# Patient Record
Sex: Female | Born: 1975
Health system: Southern US, Community
[De-identification: ages and names within clinical notes are randomized; demographics above are authoritative.]

## PROBLEM LIST (undated history)

## (undated) DIAGNOSIS — J45909 Unspecified asthma, uncomplicated: Secondary | ICD-10-CM

## (undated) DIAGNOSIS — F419 Anxiety disorder, unspecified: Secondary | ICD-10-CM

## (undated) DIAGNOSIS — K219 Gastro-esophageal reflux disease without esophagitis: Secondary | ICD-10-CM

## (undated) DIAGNOSIS — G4733 Obstructive sleep apnea (adult) (pediatric): Principal | ICD-10-CM

## (undated) DIAGNOSIS — D649 Anemia, unspecified: Secondary | ICD-10-CM

## (undated) DIAGNOSIS — T7840XA Allergy, unspecified, initial encounter: Secondary | ICD-10-CM

## (undated) HISTORY — DX: Obstructive sleep apnea (adult) (pediatric): G47.33

## (undated) HISTORY — DX: Anemia, unspecified: D64.9

## (undated) HISTORY — DX: Unspecified asthma, uncomplicated: J45.909

## (undated) HISTORY — DX: Allergy, unspecified, initial encounter: T78.40XA

## (undated) HISTORY — DX: Anxiety disorder, unspecified: F41.9

## (undated) HISTORY — DX: Gastro-esophageal reflux disease without esophagitis: K21.9

## (undated) HISTORY — PX: WISDOM TOOTH EXTRACTION: SHX21

---

## 1999-09-22 ENCOUNTER — Other Ambulatory Visit: Admission: RE | Admit: 1999-09-22 | Discharge: 1999-09-22 | Payer: Self-pay | Admitting: Obstetrics and Gynecology

## 2000-12-04 ENCOUNTER — Other Ambulatory Visit: Admission: RE | Admit: 2000-12-04 | Discharge: 2000-12-04 | Payer: Self-pay | Admitting: Obstetrics and Gynecology

## 2002-11-18 ENCOUNTER — Other Ambulatory Visit: Admission: RE | Admit: 2002-11-18 | Discharge: 2002-11-18 | Payer: Self-pay | Admitting: Obstetrics and Gynecology

## 2003-06-16 ENCOUNTER — Inpatient Hospital Stay (HOSPITAL_COMMUNITY): Admission: AD | Admit: 2003-06-16 | Discharge: 2003-06-19 | Payer: Self-pay | Admitting: Obstetrics and Gynecology

## 2003-12-08 ENCOUNTER — Other Ambulatory Visit: Admission: RE | Admit: 2003-12-08 | Discharge: 2003-12-08 | Payer: Self-pay | Admitting: Obstetrics and Gynecology

## 2004-12-27 ENCOUNTER — Other Ambulatory Visit: Admission: RE | Admit: 2004-12-27 | Discharge: 2004-12-27 | Payer: Self-pay | Admitting: Obstetrics and Gynecology

## 2005-09-27 ENCOUNTER — Inpatient Hospital Stay (HOSPITAL_COMMUNITY): Admission: AD | Admit: 2005-09-27 | Discharge: 2005-09-27 | Payer: Self-pay | Admitting: Obstetrics & Gynecology

## 2005-11-13 ENCOUNTER — Inpatient Hospital Stay (HOSPITAL_COMMUNITY): Admission: AD | Admit: 2005-11-13 | Discharge: 2005-11-15 | Payer: Self-pay | Admitting: Obstetrics & Gynecology

## 2006-02-15 ENCOUNTER — Other Ambulatory Visit: Admission: RE | Admit: 2006-02-15 | Discharge: 2006-02-15 | Payer: Self-pay | Admitting: Obstetrics and Gynecology

## 2006-03-15 ENCOUNTER — Encounter: Admission: RE | Admit: 2006-03-15 | Discharge: 2006-05-11 | Payer: Self-pay | Admitting: Sports Medicine

## 2008-10-02 ENCOUNTER — Ambulatory Visit (HOSPITAL_COMMUNITY): Admission: RE | Admit: 2008-10-02 | Discharge: 2008-10-02 | Payer: Self-pay | Admitting: Obstetrics and Gynecology

## 2008-10-02 ENCOUNTER — Encounter (INDEPENDENT_AMBULATORY_CARE_PROVIDER_SITE_OTHER): Payer: Self-pay | Admitting: Obstetrics and Gynecology

## 2009-05-30 ENCOUNTER — Observation Stay (HOSPITAL_COMMUNITY): Admission: AD | Admit: 2009-05-30 | Discharge: 2009-05-31 | Payer: Self-pay | Admitting: Obstetrics and Gynecology

## 2009-06-01 ENCOUNTER — Ambulatory Visit (HOSPITAL_COMMUNITY): Admission: RE | Admit: 2009-06-01 | Discharge: 2009-06-01 | Payer: Self-pay | Admitting: Obstetrics and Gynecology

## 2009-06-19 ENCOUNTER — Inpatient Hospital Stay (HOSPITAL_COMMUNITY): Admission: AD | Admit: 2009-06-19 | Discharge: 2009-06-19 | Payer: Self-pay | Admitting: Obstetrics and Gynecology

## 2009-06-20 ENCOUNTER — Inpatient Hospital Stay (HOSPITAL_COMMUNITY): Admission: AD | Admit: 2009-06-20 | Discharge: 2009-07-06 | Payer: Self-pay | Admitting: Obstetrics and Gynecology

## 2009-07-06 ENCOUNTER — Encounter: Payer: Self-pay | Admitting: Obstetrics and Gynecology

## 2009-10-07 ENCOUNTER — Encounter (INDEPENDENT_AMBULATORY_CARE_PROVIDER_SITE_OTHER): Payer: Self-pay | Admitting: Obstetrics and Gynecology

## 2009-10-07 ENCOUNTER — Inpatient Hospital Stay (HOSPITAL_COMMUNITY): Admission: RE | Admit: 2009-10-07 | Discharge: 2009-10-08 | Payer: Self-pay | Admitting: Obstetrics and Gynecology

## 2009-11-04 ENCOUNTER — Ambulatory Visit: Admission: RE | Admit: 2009-11-04 | Discharge: 2009-11-04 | Payer: Self-pay | Admitting: Obstetrics and Gynecology

## 2009-11-16 LAB — CONVERTED CEMR LAB: Pap Smear: NORMAL

## 2010-03-10 ENCOUNTER — Ambulatory Visit: Payer: Self-pay | Admitting: Family

## 2010-03-11 ENCOUNTER — Encounter: Payer: Self-pay | Admitting: Family

## 2010-03-15 ENCOUNTER — Encounter: Payer: Self-pay | Admitting: Family

## 2010-04-20 ENCOUNTER — Ambulatory Visit: Payer: Self-pay | Admitting: Family

## 2010-04-20 LAB — CONVERTED CEMR LAB
ALT: 24 units/L (ref 0–35)
AST: 20 units/L (ref 0–37)
Albumin: 4.1 g/dL (ref 3.5–5.2)
Alkaline Phosphatase: 81 units/L (ref 39–117)
Basophils Relative: 0 % (ref 0–1)
CO2: 22 meq/L (ref 19–32)
Calcium: 8.6 mg/dL (ref 8.4–10.5)
Chloride: 105 meq/L (ref 96–112)
Cholesterol: 143 mg/dL (ref 0–200)
Creatinine, Ser: 0.64 mg/dL (ref 0.40–1.20)
Eosinophils Absolute: 0.4 10*3/uL (ref 0.0–0.7)
Eosinophils Relative: 4 % (ref 0–5)
Glucose, Bld: 94 mg/dL (ref 70–99)
HCT: 36.2 % (ref 36.0–46.0)
Hemoglobin: 11.8 g/dL — ABNORMAL LOW (ref 12.0–15.0)
LDL Cholesterol: 79 mg/dL (ref 0–99)
Lymphocytes Relative: 33 % (ref 12–46)
Lymphs Abs: 4.1 10*3/uL — ABNORMAL HIGH (ref 0.7–4.0)
MCHC: 32.6 g/dL (ref 30.0–36.0)
MCV: 86.4 fL (ref 78.0–100.0)
Monocytes Relative: 7 % (ref 3–12)
Neutro Abs: 7.1 10*3/uL (ref 1.7–7.7)
Neutrophils Relative %: 57 % (ref 43–77)
Platelets: 319 10*3/uL (ref 150–400)
Potassium: 4.2 meq/L (ref 3.5–5.3)
RBC: 4.19 M/uL (ref 3.87–5.11)
Sodium: 139 meq/L (ref 135–145)
TSH: 1.469 microintl units/mL (ref 0.350–4.500)
Total Bilirubin: 0.4 mg/dL (ref 0.3–1.2)
Total CHOL/HDL Ratio: 3.4
Total Protein: 7.2 g/dL (ref 6.0–8.3)
VLDL: 22 mg/dL (ref 0–40)
WBC: 12.5 10*3/uL — ABNORMAL HIGH (ref 4.0–10.5)

## 2010-04-21 ENCOUNTER — Telehealth: Payer: Self-pay | Admitting: Family

## 2010-04-21 ENCOUNTER — Encounter: Payer: Self-pay | Admitting: Family

## 2010-04-21 DIAGNOSIS — D509 Iron deficiency anemia, unspecified: Secondary | ICD-10-CM

## 2010-04-21 LAB — CONVERTED CEMR LAB
Ferritin: 22 ng/mL (ref 10–291)
Folate: >20 ng/mL
Iron: 41 ug/dL — ABNORMAL LOW (ref 42–145)
Saturation Ratios: 13 % — ABNORMAL LOW (ref 20–55)
TIBC: 312 ug/dL (ref 250–470)
UIBC: 271 ug/dL
Vitamin B-12: 671 pg/mL (ref 211–911)

## 2010-10-15 ENCOUNTER — Ambulatory Visit: Payer: Self-pay | Admitting: Family

## 2010-10-15 LAB — CONVERTED CEMR LAB
Inflenza A Ag: NEGATIVE
Influenza B Ag: NEGATIVE
Rapid Strep: NEGATIVE

## 2010-11-21 IMAGING — US US OB FOLLOW-UP
1 series · 14 of 28 positions shown · non-contrast
Comparison: none

OBSTETRICAL ULTRASOUND:
 This ultrasound exam was performed in the [HOSPITAL] Ultrasound Department.  The OB US report was generated in the AS system, and faxed to the ordering physician.  This report is also available in [REDACTED] PACS.

[Series 1: us ob follow up · 0.25mm/px · 14 of 58 slices shown]
[im 3/58]
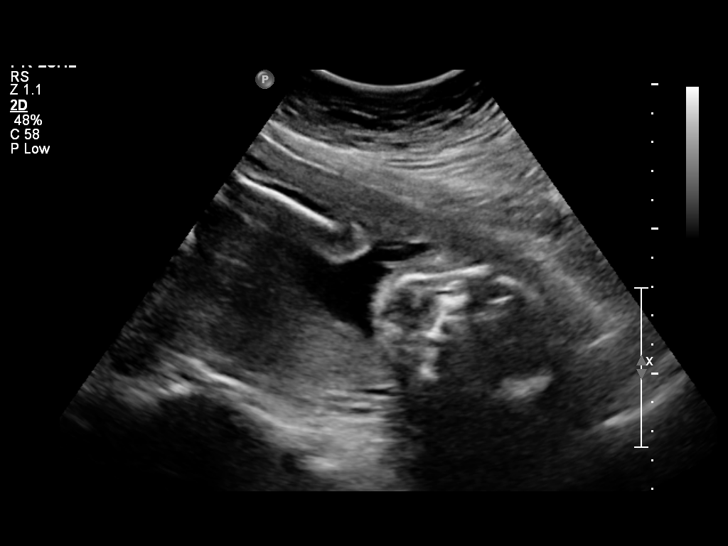
[im 7/58]
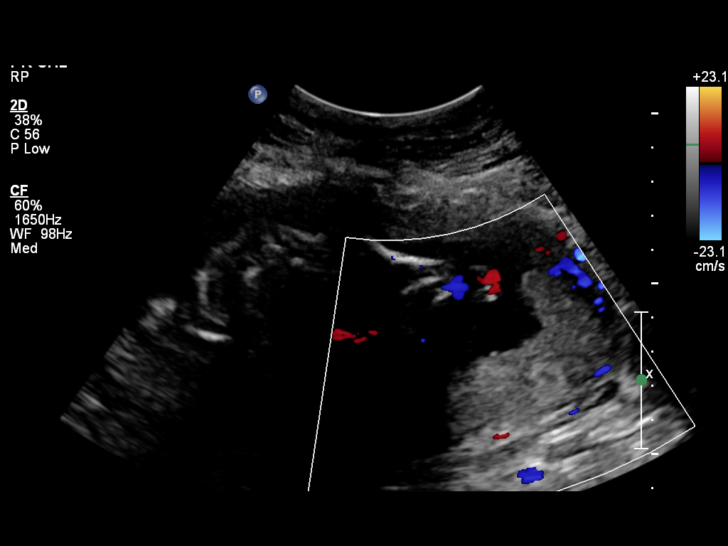
[im 11/58]
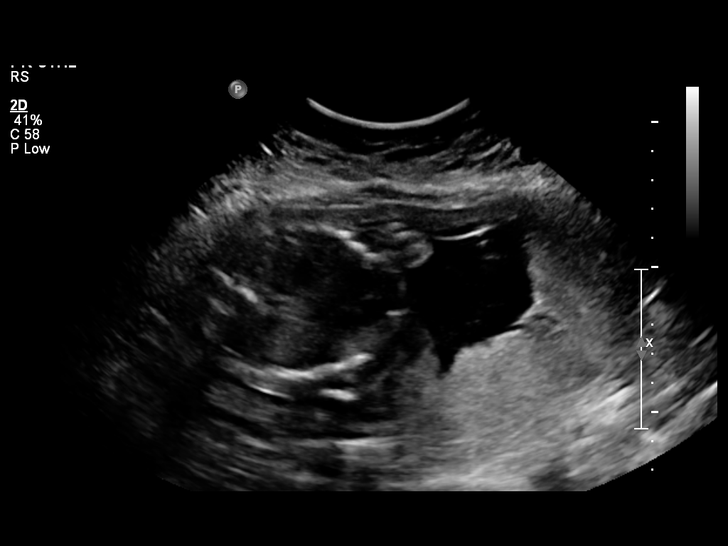
[im 15/58]
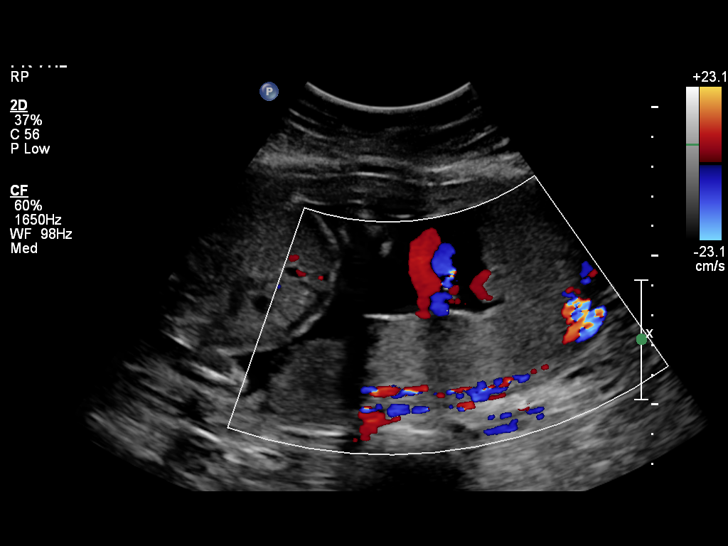
[im 20/58]
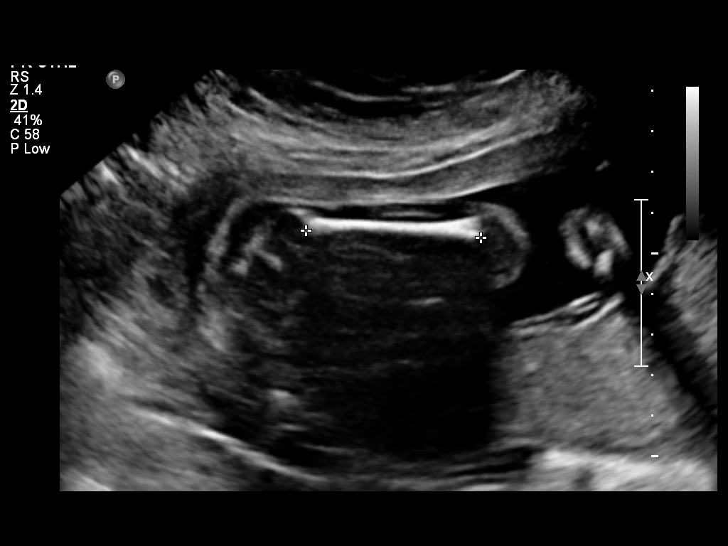
[im 24/58]
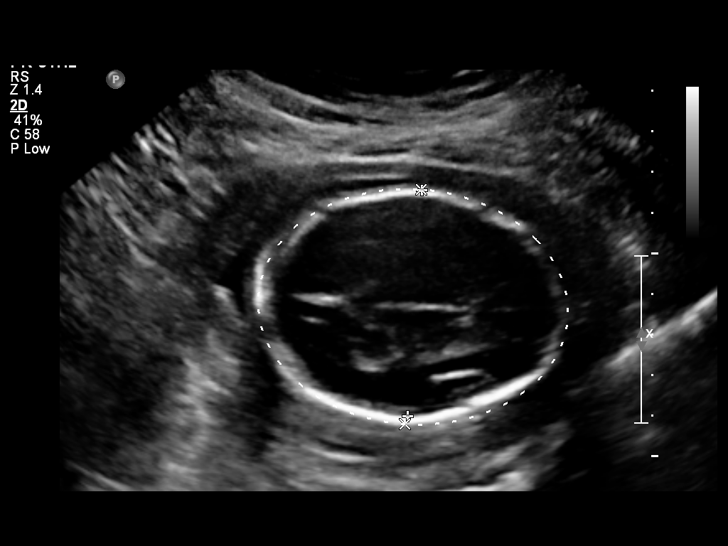
[im 28/58]
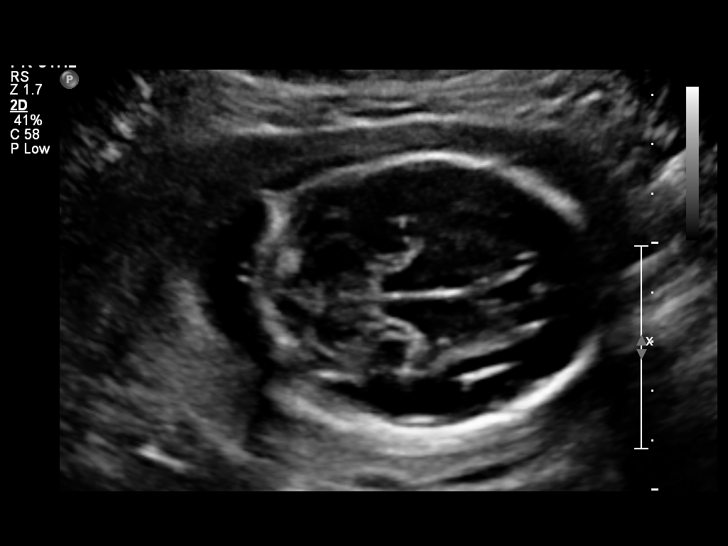
[im 32/58]
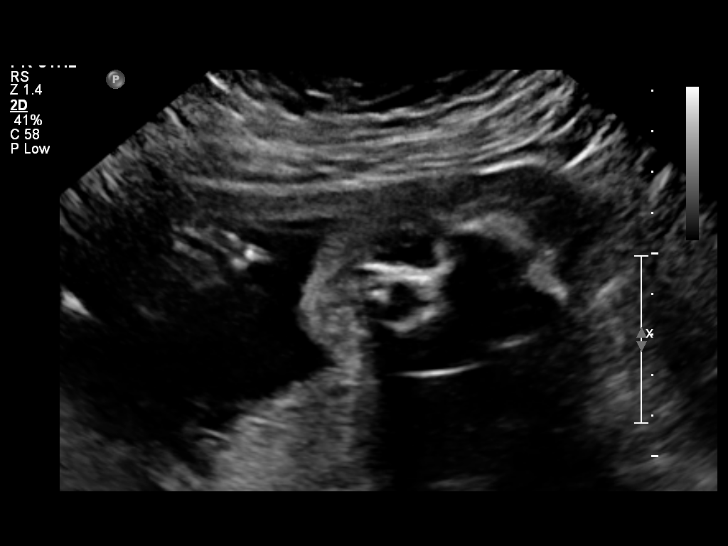
[im 36/58]
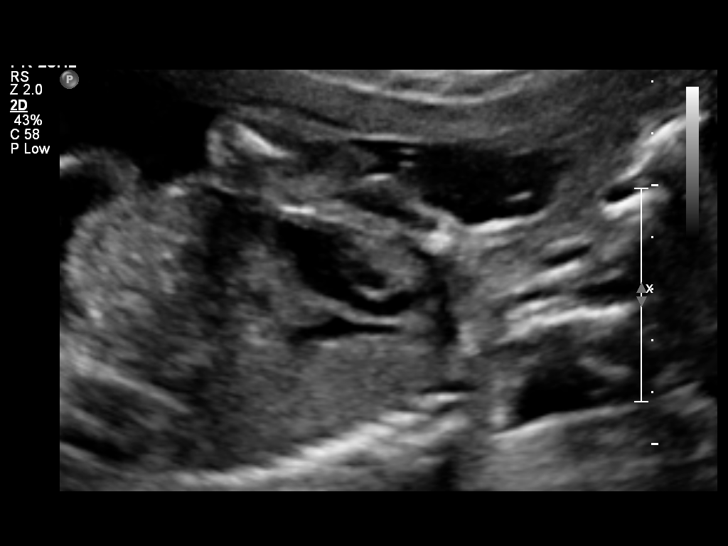
[im 41/58]
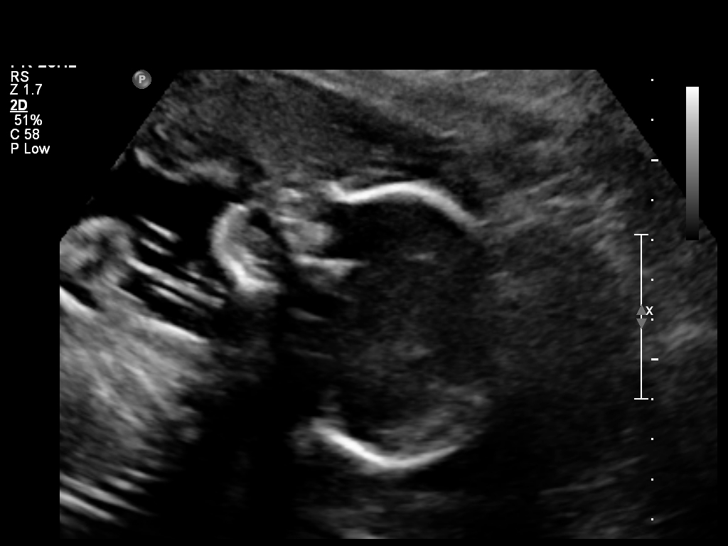
[im 45/58]
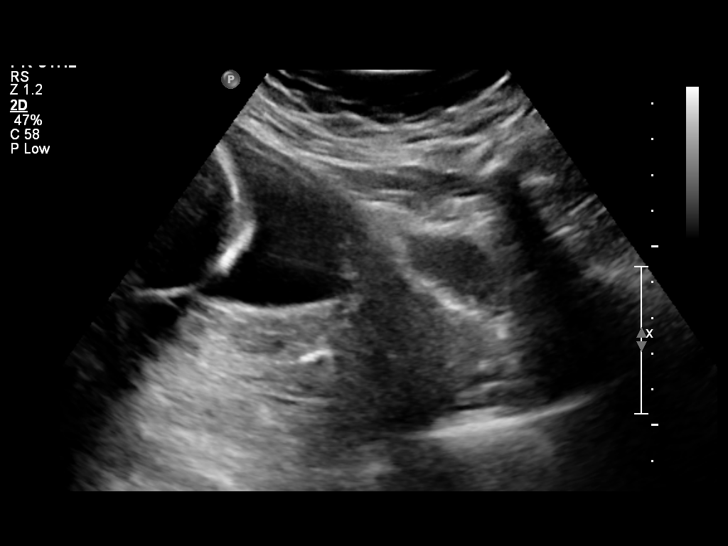
[im 49/58]
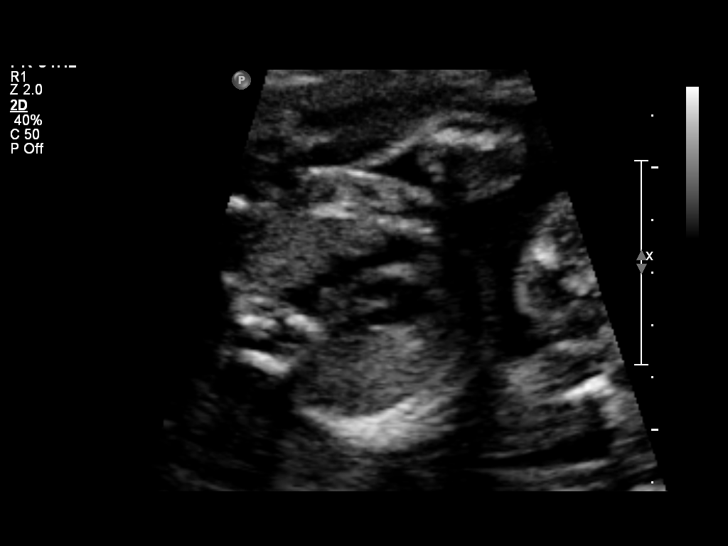
[im 53/58]
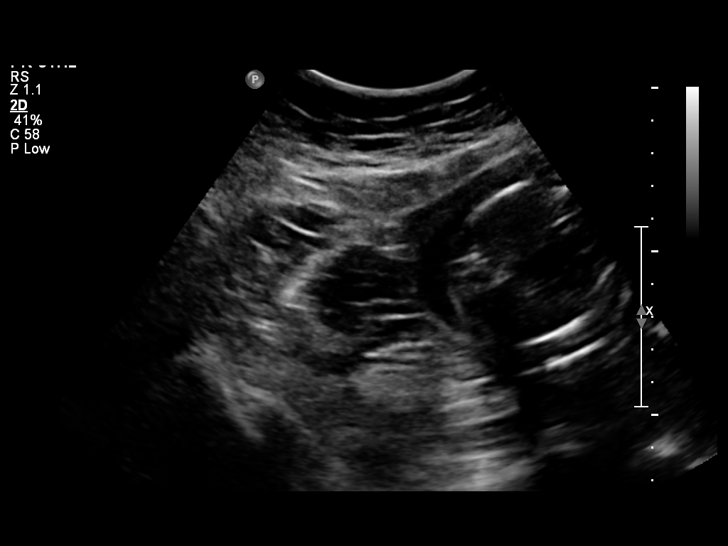
[im 58/58]
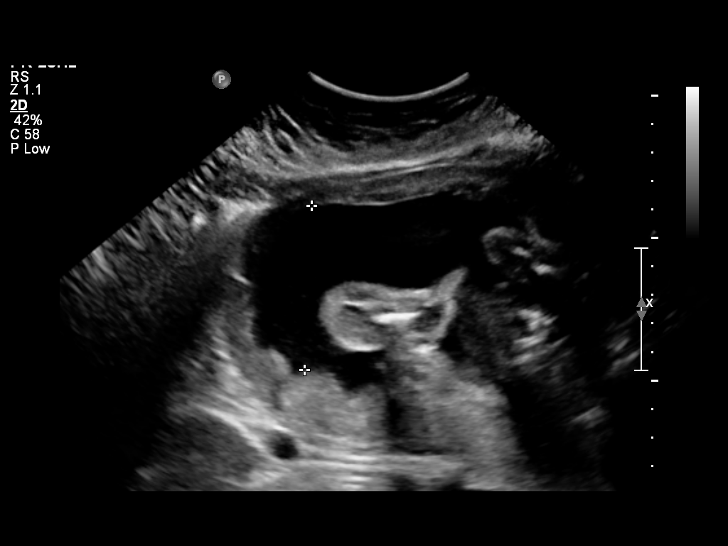

[14 of 28 positions shown; findings below may reference images not displayed]

IMPRESSION: See AS Obstetric US report.

## 2010-12-06 ENCOUNTER — Encounter: Payer: Self-pay | Admitting: Obstetrics and Gynecology

## 2010-12-14 NOTE — Assessment & Plan Note (Signed)
Summary: NEW PT FEELS LIKE SHE HAS FLU/DT   Vital Signs:  Patient profile:   35 year old female Height:      63.5 inches Weight:      215.50 pounds BMI:     37.71 Temp:     98.0 degrees F oral Pulse rate:   84 / minute Pulse rhythm:   regular Resp:     16 per minute BP sitting:   130 / 86  (right arm) Cuff size:   k  Vitals Entered By: Mervin Kung CMA (March 10, 2010 9:28 AM) CC: room 5   Head congestion x 1 week, productive cough and fatigue. Is Patient Diabetic? No   CC:  room 5   Head congestion x 1 week and productive cough and fatigue.Marland Kitchen  History of Present Illness: Valerie Burke is a 35 year old female who presents today to establish care.  She notes + sore throat, nasal congestion, malaise x 1 week.   Denies fever.  Notes intermittent sinus pressure- nasal discharge is yellow and thick, sputum also yellow.   + cough, breathing feels tight.  Has tried nothing OTC.  Notes that her daughter has RSV.  Preventive Screening-Counseling & Management  Alcohol-Tobacco     Alcohol drinks/day: <1     Alcohol type: wine     Smoking Status: never  Caffeine-Diet-Exercise     Caffeine use/day: 2-3 cans daily     Does Patient Exercise: no  Allergies (verified): No Known Drug Allergies  Past History:  Past Medical History: Anemia in pregnancy Asthma Chicken Pox Frequent Headaches UTIs  Family History: Heart Disease--maternal grandfather, deceased Stroke--maternal grandfather Alcoholism, Cirrhosis--paternal grandfather  Social History: Married Never Smoked Alcohol use-yes Regular exercise-no Smoking Status:  never Caffeine use/day:  2-3 cans daily Does Patient Exercise:  no  Physical Exam  General:  Well-developed,well-nourished,in no acute distress; alert,appropriate and cooperative throughout examination Head:  Normocephalic and atraumatic without obvious abnormalities. No apparent alopecia or balding. Ears:  External ear exam shows no significant lesions or  deformities.  Otoscopic examination reveals clear canals, tympanic membranes are intact bilaterally without bulging, retraction, inflammation or discharge. Hearing is grossly normal bilaterally. Mouth:  + pharyngeal erythema without exudate Lungs:  Normal respiratory effort, chest expands symmetrically. Lungs are clear to auscultation, no crackles or wheezes. Heart:  Normal rate and regular rhythm. S1 and S2 normal without gallop, murmur, click, rub or other extra sounds. Cervical Nodes:  mildly enlarged cervical lymph nodes bilaterally.   Psych:  Cognition and judgment appear intact. Alert and cooperative with normal attention span and concentration. No apparent delusions, illusions, hallucinations   Impression & Recommendations:  Problem # 1:  SINUSITIS (ICD-473.9) Assessment New Will treat with Augmentin.  Plan to send throat culture to rule out strep.  Suspect mild associated bronchitis as well.  Pt to call if symptoms worsen or do not improve.  Her updated medication list for this problem includes:    Augmentin 500-125 Mg Tabs (Amoxicillin-pot clavulanate) ..... One tablet by mouth two times a day x 10 days  Complete Medication List: 1)  Xyzal 5 Mg Tabs (Levocetirizine dihydrochloride) .... Take 1 tablet by mouth once a day 2)  Augmentin 500-125 Mg Tabs (Amoxicillin-pot clavulanate) .... One tablet by mouth two times a day x 10 days  Other Orders: T-Culture, Throat ([id number])  Patient Instructions: 1)  Call if symptoms worsen or do not improve. 2)  Please return fasting for a complete physical in 1 month. Prescriptions: XYZAL 5 MG  TABS (LEVOCETIRIZINE DIHYDROCHLORIDE) Take 1 tablet by mouth once a day  #30 x 2   Entered and Authorized by:   Valerie Burke   Signed by:   Valerie Burke on 03/10/2010   Method used:   Electronically to        [facility] [phone]* (retail)       [address]       [place], [place]  [address]       Ph: [phone]       Fax: [phone]   RxID:   [id number] AUGMENTIN 500-125 MG TABS (AMOXICILLIN-POT CLAVULANATE) one tablet by mouth two times a day x 10 days  #20 x 0   Entered and Authorized by:   Valerie Burke   Signed by:   Valerie Burke on 03/10/2010   Method used:   Electronically to        [facility] #[id number]* (retail)       [address]       [place], [place]  [address]       Ph: [phone]       Fax: [phone]   RxID:   [id number]    Preventive Care Screening  Pap Smear:    Date:  11/16/2009    Results:  normal   PPD:    Date:  11/16/1998    Results:  negative     Immunization History:  Influenza Immunization History:    Influenza:  historical (09/14/2009)   Current Allergies (reviewed today): No known allergies

## 2010-12-14 NOTE — Miscellaneous (Signed)
  Clinical Lists Changes  Problems: Added new problem of ANEMIA (ICD-285.9) 

## 2010-12-14 NOTE — Letter (Signed)
   [facility] [address] [place], [place]  [address]  [place] Phone: [phone]      Mar 15, 2010   GWENDLOYN FORSEE [address] [place], [place] [address]  RE:  LAB RESULTS  Dear  Ms. Leblond,  The following is an interpretation of your most recent lab tests.  Please take note of any instructions provided or changes to medications that have resulted from your lab work. Throat culture is negative   Sincerely Yours,    Lemont Fillers FNP

## 2010-12-14 NOTE — Assessment & Plan Note (Signed)
Summary: cpx/mhf   Vital Signs:  Patient profile:   35 year old female Height:      63.5 inches Weight:      218.50 pounds BMI:     38.24 Temp:     97.6 degrees F oral Pulse rate:   84 / minute Pulse rhythm:   regular Resp:     16 per minute BP sitting:   124 / 80  (right arm) Cuff size:   large  Vitals Entered By: Mervin Kung CMA (April 20, 2010 9:16 AM) CC: room 4  Physical. Pt states that she still has a lingering cough since the end of April. Is Patient Diabetic? No   CC:  room 4  Physical. Pt states that she still has a lingering cough since the end of April.Marland Kitchen  History of Present Illness: Valerie Burke is a 35 year old female who presents today for a complete physical exam.    Preventative-  Does not exercise regulary.   Non-smoker.  She notes that she is up about 20 pounds since she became pregnant with her 41 month old daughter.  Diet is poor- does not eat regularly. Pap smear up to date (Dr. Waynard Reeds at [facility])  Preventive Screening-Counseling & Management  Alcohol-Tobacco     Alcohol drinks/day: <1     Alcohol type: mixed drink     Smoking Status: never  Caffeine-Diet-Exercise     Does Patient Exercise: no  Allergies (verified): No Known Drug Allergies  Family History: Heart Disease--maternal grandfather, deceased Stroke--maternal grandfather Alcoholism, Cirrhosis--paternal grandfather  Mom- living, allergies, asthma, osteoporosis Dad- living Gout Brother- healthy 2004 Girls- "sensory integration disorder" undergoing PT/OT 2006 Girl- health 2010 Girl- eczema  Social History: Married Never Smoked Alcohol use-yes rare (every few months) Regular exercise-no  Review of Systems       Constitutional: Denies Fever ENT:  Denies nasal congestion or sore throat. Resp: mild cough has been told that she has history of "allergy induced asthma." CV:  Denies Chest Pain GI:  Denies nausea or vomitting GU: Denies dysuria Lymphatic: Denies  lymphadenopathy Musculoskeletal:  Denies muscle/joint pain Skin:  Denies Rashes or lesions Psychiatric: Denies depression or anxiety Neuro: Denies numbness or weakness     Physical Exam  General:  Well-developed, overweight white female, in no acute distress; alert,appropriate and cooperative throughout examination Head:  Normocephalic and atraumatic without obvious abnormalities. No apparent alopecia or balding. Eyes:  PERRLA Ears:  External ear exam shows no significant lesions or deformities.  Otoscopic examination reveals clear canals, tympanic membranes are intact bilaterally without bulging, retraction, inflammation or discharge. Hearing is grossly normal bilaterally. Mouth:  Oral mucosa and oropharynx without lesions or exudates.  Teeth in good repair. Neck:  No deformities, masses, or tenderness noted. Breasts:  deferred (completed by GYN) Lungs:  Normal respiratory effort, chest expands symmetrically. Lungs are clear to auscultation, no crackles or wheezes. Heart:  Normal rate and regular rhythm. S1 and S2 normal without gallop, murmur, click, rub or other extra sounds. Abdomen:  Bowel sounds positive,abdomen soft and non-tender without masses, organomegaly or hernias noted. Genitalia:  deferred to GYN Msk:  normal ROM, no joint swelling, and no joint warmth.   Extremities:  No clubbing, cyanosis, edema, or deformity noted with normal full range of motion of all joints.   Neurologic:  No cranial nerve deficits noted. Station and gait are normal. Plantar reflexes are down-going bilaterally. DTRs are symmetrical throughout. Sensory, motor and coordinative functions appear intact. Skin:  nodular scar on right lateral shin and left shin in area of previous mosquito bite. Cervical Nodes:  No lymphadenopathy noted Psych:  Cognition and judgment appear intact. Alert and cooperative with normal attention span and concentration. No apparent delusions, illusions,  hallucinations   Impression & Recommendations:  Problem # 1:  Preventive Health Care (ICD-V70.0) Assessment Comment Only  Patient counseled on weight loss and exercise.  She plans to join [employer]. Immunizations reviewed and up to date.  Will check fasting lab work today.  Orders: T-Comprehensive Metabolic Panel [id number]) T-Lipid Profile [id number]) T-CBC w/Diff [id number]) T-TSH [id number])  Complete Medication List: 1)  Xyzal 5 Mg Tabs (Levocetirizine dihydrochloride) .... Take 1 tablet by mouth once a day 2)  Caltrate 600+d 600-400 Mg-unit Tabs (Calcium carbonate-vitamin d) .... One tablet by mouth two times a day 3)  Mirena 20 Mcg/24hr Iud (Levonorgestrel)  Patient Instructions: 1)  Please schedule a follow-up appointment in 1 year. 2)  It is important that you exercise regularly at least 20 minutes 5 times a week. If you develop chest pain, have severe difficulty breathing, or feel very tired , stop exercising immediately and seek medical attention. 3)  Good luck with weight watchers.    Current Allergies (reviewed today): No known allergies

## 2010-12-14 NOTE — Assessment & Plan Note (Signed)
Summary: congestion cough/mhf--rm 5   Vital Signs:  Patient profile:   35 year old female Height:      63.5 inches Weight:      218 pounds BMI:     38.15 Temp:     98.1 degrees F oral Pulse rate:   102 / minute Pulse rhythm:   regular Resp:     18 per minute BP sitting:   102 / 80  (right arm) Cuff size:   large  Vitals Entered By: Mervin Kung CMA Duncan Dull) (October 15, 2010 1:12 PM) CC: Pt states she has had cough and body aches x 2 days. Is Patient Diabetic? No Pain Assessment Patient in pain? no      Comments Pt states she is not currently taking clatrate or multivit but plans to restart. Nicki Guadalajara Fergerson CMA Duncan Dull)  October 15, 2010 1:24 PM    Primary Care Provider:  Lemont Fillers FNP  CC:  Pt states she has had cough and body aches x 2 days.Marland Kitchen  History of Present Illness: Valerie Burke is a 35 year old female who presents with c/o of cough which has been present for approximately 4 days.  + pleuritic pain with cough.  Has not tried any OTC meds.  Denies fever.  +sore throat, denies ear pain.  + nasal congestion- mainly clear nasal discharge.  Allergies (verified): No Known Drug Allergies  Past History:  Past Medical History: Last updated: 03/10/2010 Anemia in pregnancy Asthma Chicken Pox Frequent Headaches UTIs  Review of Systems       see HPI  Physical Exam  General:  Well-developed,well-nourished,in no acute distress; alert,appropriate and cooperative throughout examination Head:  Normocephalic and atraumatic without obvious abnormalities. No apparent alopecia or balding. Eyes:  PERRLA, sclera are clear Mouth:  Oral mucosa and oropharynx without lesions or exudates.  Teeth in good repair. Lungs:  Normal respiratory effort, chest expands symmetrically. Lungs are clear to auscultation, no crackles or wheezes. Heart:  Normal rate and regular rhythm. S1 and S2 normal without gallop, murmur, click, rub or other extra sounds.   Impression &  Recommendations:  Problem # 1:  VIRAL URI (ICD-465.9) Assessment New Rapid strep and rapid flu negative. Plan supportive measures as outlined in pt sign out.  Tessalon as needed for cough. Pt instructed to call if symptoms worsen or do not improve.   Her updated medication list for this problem includes:    Xyzal 5 Mg Tabs (Levocetirizine dihydrochloride) .Marland Kitchen... Take 1 tablet by mouth once a day    Mucinex Dm 30-600 Mg Xr12h-tab (Dextromethorphan-guaifenesin) ..... One tablet by mouth two times a day as needed for chest congestion    Tessalon Perles 100 Mg Caps (Benzonatate) ..... One cap by mouth three times a day as needed for cough  Complete Medication List: 1)  Xyzal 5 Mg Tabs (Levocetirizine dihydrochloride) .... Take 1 tablet by mouth once a day 2)  Caltrate 600+d 600-400 Mg-unit Tabs (Calcium carbonate-vitamin d) .... One tablet by mouth two times a day 3)  Mirena 20 Mcg/24hr Iud (Levonorgestrel) 4)  Multivitamin With Minerals  .... One tablet by mouth daily 5)  Mucinex Dm 30-600 Mg Xr12h-tab (Dextromethorphan-guaifenesin) .... One tablet by mouth two times a day as needed for chest congestion 6)  Tessalon Perles 100 Mg Caps (Benzonatate) .... One cap by mouth three times a day as needed for cough  Other Orders: Flu A+B (52778) Admin 1st Vaccine (24235) Flu Vaccine 65yrs + (36144) Rapid Strep (31540)  Patient Instructions: 1)  Please call if you develop fever, if symptoms worsen, or if they are not improved in 48-72 hours. 2)  Take 400-600mg  of Ibuprofen (Advil, Motrin) with food every 4-6 hours as needed for relief of pain or comfort of fever. Prescriptions: TESSALON PERLES 100 MG CAPS (BENZONATATE) one cap by mouth three times a day as needed for cough  #30 x 0   Entered and Authorized by:   Lemont Fillers FNP   Signed by:   Lemont Fillers FNP on 10/15/2010   Method used:   Electronically to        [facility] #[id number]* (retail)       [address]       [place], [place]  [address]       Ph: [phone]       Fax: [phone]   RxID:   [id number]    Orders Added: 1)  Flu A+B [87400] 2)  Admin 1st Vaccine [90471] 3)  Flu Vaccine 22yrs + [64332] 4)  Rapid Strep [95188] 5)  Est. Patient Level III [41660]    Influenza Vaccine (to be given today)  Current Allergies (reviewed today): No known allergies  Flu Vaccine Consent Questions     Do you have a history of severe allergic reactions to this vaccine? no    Any prior history of allergic reactions to egg and/or gelatin? no    Do you have a sensitivity to the preservative Thimersol? no    Do you have a past history of Guillan-Barre Syndrome? no    Do you currently have an acute febrile illness? no    Have you ever had a severe reaction to latex? no    Vaccine information given and explained to patient? yes    Are you currently pregnant? no    Lot Number:[id number]   Exp Date:05/14/2011   Site Given  right Deltoid IM   Laboratory Results    Other Tests  Rapid Strep: negative Influenza A: negative Influenza B: negative  Kit Test Internal QC: Positive   (Normal Range: Negative)

## 2010-12-14 NOTE — Progress Notes (Signed)
Summary: lab results  Phone Note Outgoing Call Call back at [phone] cell   Summary of Call: Pls call patient and let her know that she is mildly anemic with a mild iron deficiency.  She should add a multivitamin with minerals daily.  Also, pls arrange a f/u CBC in 2 weeks, WBC was up just a touch- I want to make sure it comes back down.  Initial call taken by: Lemont Fillers FNP,  April 21, 2010 5:55 PM  Follow-up for Phone Call        Left message on machine to retrun my call.  Mervin Kung CMA  April 22, 2010 10:16 AM   Pt returned my call and was notified per [facility] instructions.  Lab appt scheduled for 05/07/10.  Pt. voices understanding. Order faxed to lab.  Mervin Kung CMA  April 22, 2010 12:10 PM     New/Updated Medications: * MULTIVITAMIN WITH MINERALS one tablet by mouth daily

## 2011-02-16 LAB — CBC
HCT: 26.5 % — ABNORMAL LOW (ref 36.0–46.0)
HCT: 27.6 % — ABNORMAL LOW (ref 36.0–46.0)
Hemoglobin: 9 g/dL — ABNORMAL LOW (ref 12.0–15.0)
Hemoglobin: 9.3 g/dL — ABNORMAL LOW (ref 12.0–15.0)
MCHC: 33.8 g/dL (ref 30.0–36.0)
MCHC: 34 g/dL (ref 30.0–36.0)
MCV: 87.2 fL (ref 78.0–100.0)
MCV: 87.8 fL (ref 78.0–100.0)
Platelets: 252 10*3/uL (ref 150–400)
Platelets: 282 10*3/uL (ref 150–400)
RBC: 3.02 MIL/uL — ABNORMAL LOW (ref 3.87–5.11)
RBC: 3.17 MIL/uL — ABNORMAL LOW (ref 3.87–5.11)
RDW: 15.5 % (ref 11.5–15.5)
RDW: 15.6 % — ABNORMAL HIGH (ref 11.5–15.5)
WBC: 16.7 10*3/uL — ABNORMAL HIGH (ref 4.0–10.5)
WBC: 16.9 10*3/uL — ABNORMAL HIGH (ref 4.0–10.5)

## 2011-02-16 LAB — RPR: RPR Ser Ql: NONREACTIVE

## 2011-02-19 LAB — TYPE AND SCREEN
ABO/RH(D): A POS
ABO/RH(D): A POS
Antibody Screen: NEGATIVE
Antibody Screen: NEGATIVE

## 2011-02-19 LAB — URINALYSIS, MICROSCOPIC ONLY
Bilirubin Urine: NEGATIVE
Glucose, UA: NEGATIVE mg/dL
Ketones, ur: NEGATIVE mg/dL
Leukocytes, UA: NEGATIVE
Nitrite: NEGATIVE
Nitrite: NEGATIVE
Protein, ur: NEGATIVE mg/dL
Specific Gravity, Urine: 1.02 (ref 1.005–1.030)
Specific Gravity, Urine: 1.025 (ref 1.005–1.030)
Urobilinogen, UA: 0.2 mg/dL (ref 0.0–1.0)
Urobilinogen, UA: 0.2 mg/dL (ref 0.0–1.0)
pH: 8 (ref 5.0–8.0)

## 2011-02-19 LAB — CBC
HCT: 28.6 % — ABNORMAL LOW (ref 36.0–46.0)
HCT: 27.3 % — ABNORMAL LOW (ref 36.0–46.0)
HCT: 31.1 % — ABNORMAL LOW (ref 36.0–46.0)
Hemoglobin: 10 g/dL — ABNORMAL LOW (ref 12.0–15.0)
Hemoglobin: 10.7 g/dL — ABNORMAL LOW (ref 12.0–15.0)
Hemoglobin: 9.3 g/dL — ABNORMAL LOW (ref 12.0–15.0)
Hemoglobin: 9.5 g/dL — ABNORMAL LOW (ref 12.0–15.0)
Hemoglobin: 9.7 g/dL — ABNORMAL LOW (ref 12.0–15.0)
MCHC: 35.1 g/dL (ref 30.0–36.0)
MCHC: 34.5 g/dL (ref 30.0–36.0)
MCHC: 35.1 g/dL (ref 30.0–36.0)
MCHC: 35.2 g/dL (ref 30.0–36.0)
MCHC: 35.4 g/dL (ref 30.0–36.0)
MCV: 91.1 fL (ref 78.0–100.0)
MCV: 89.7 fL (ref 78.0–100.0)
MCV: 90.6 fL (ref 78.0–100.0)
Platelets: 269 10*3/uL (ref 150–400)
Platelets: 249 10*3/uL (ref 150–400)
Platelets: 252 10*3/uL (ref 150–400)
Platelets: 254 10*3/uL (ref 150–400)
RBC: 3.14 MIL/uL — ABNORMAL LOW (ref 3.87–5.11)
RBC: 3.01 MIL/uL — ABNORMAL LOW (ref 3.87–5.11)
RBC: 3.04 MIL/uL — ABNORMAL LOW (ref 3.87–5.11)
RBC: 3.09 MIL/uL — ABNORMAL LOW (ref 3.87–5.11)
RBC: 3.44 MIL/uL — ABNORMAL LOW (ref 3.87–5.11)
RDW: 14.5 % (ref 11.5–15.5)
RDW: 13.5 % (ref 11.5–15.5)
RDW: 13.6 % (ref 11.5–15.5)
RDW: 13.8 % (ref 11.5–15.5)
RDW: 14.1 % (ref 11.5–15.5)
RDW: 14.1 % (ref 11.5–15.5)
WBC: 19.8 10*3/uL — ABNORMAL HIGH (ref 4.0–10.5)
WBC: 15.5 10*3/uL — ABNORMAL HIGH (ref 4.0–10.5)
WBC: 18.8 10*3/uL — ABNORMAL HIGH (ref 4.0–10.5)

## 2011-02-19 LAB — APTT: aPTT: 24 seconds (ref 24–37)

## 2011-02-19 LAB — URINALYSIS, ROUTINE W REFLEX MICROSCOPIC
Bilirubin Urine: NEGATIVE
Glucose, UA: NEGATIVE mg/dL
Ketones, ur: 15 mg/dL — AB
Leukocytes, UA: NEGATIVE
Nitrite: NEGATIVE
Protein, ur: 30 mg/dL — AB
Specific Gravity, Urine: 1.025 (ref 1.005–1.030)
Urobilinogen, UA: 0.2 mg/dL (ref 0.0–1.0)
pH: 6 (ref 5.0–8.0)

## 2011-02-19 LAB — COMPREHENSIVE METABOLIC PANEL
ALT: 10 U/L (ref 0–35)
AST: 13 U/L (ref 0–37)
Alkaline Phosphatase: 69 U/L (ref 39–117)
CO2: 25 mEq/L (ref 19–32)
GFR calc Af Amer: >60 mL/min (ref >60)
Glucose, Bld: 79 mg/dL (ref 70–99)
Potassium: 4 mEq/L (ref 3.5–5.1)
Sodium: 137 mEq/L (ref 135–145)
Total Protein: 5.7 g/dL — ABNORMAL LOW (ref 6.0–8.3)

## 2011-02-19 LAB — STREP B DNA PROBE: Strep Group B Ag: NEGATIVE

## 2011-02-19 LAB — URINE MICROSCOPIC-ADD ON

## 2011-02-19 LAB — DIFFERENTIAL
Basophils Absolute: 0 10*3/uL (ref 0.0–0.1)
Basophils Relative: 0 % (ref 0–1)
Basophils Relative: 0 % (ref 0–1)
Eosinophils Absolute: 0 10*3/uL (ref 0.0–0.7)
Eosinophils Absolute: 0.2 10*3/uL (ref 0.0–0.7)
Eosinophils Relative: 1 % (ref 0–5)
Lymphocytes Relative: 6 % — ABNORMAL LOW (ref 12–46)
Monocytes Absolute: 0.9 10*3/uL (ref 0.1–1.0)
Monocytes Relative: 6 % (ref 3–12)
Monocytes Relative: 7 % (ref 3–12)
Neutro Abs: 12 10*3/uL — ABNORMAL HIGH (ref 1.7–7.7)
Neutrophils Relative %: 69 % (ref 43–77)
Neutrophils Relative %: 92 % — ABNORMAL HIGH (ref 43–77)

## 2011-02-19 LAB — FIBRINOGEN: Fibrinogen: 586 mg/dL — ABNORMAL HIGH (ref 204–475)

## 2011-02-19 LAB — URINE CULTURE
Colony Count: >=100000
Special Requests: POSITIVE

## 2011-02-19 LAB — PROTIME-INR
INR: 1 (ref 0.00–1.49)
Prothrombin Time: 12.6 seconds (ref 11.6–15.2)

## 2011-02-20 LAB — DIFFERENTIAL
Blasts: 0 %
Lymphocytes Relative: 24 % (ref 12–46)
Lymphs Abs: 3.9 10*3/uL (ref 0.7–4.0)
Myelocytes: 0 %
Neutro Abs: 11.2 10*3/uL — ABNORMAL HIGH (ref 1.7–7.7)
Neutrophils Relative %: 68 % (ref 43–77)
Promyelocytes Absolute: 0 %
nRBC: 0 /100 WBC

## 2011-02-20 LAB — APTT: aPTT: 26 seconds (ref 24–37)

## 2011-02-20 LAB — TYPE AND SCREEN
ABO/RH(D): A POS
Antibody Screen: NEGATIVE

## 2011-02-20 LAB — CBC
MCHC: 35.3 g/dL (ref 30.0–36.0)
MCV: 89.5 fL (ref 78.0–100.0)
Platelets: 247 10*3/uL (ref 150–400)
RBC: 3.39 MIL/uL — ABNORMAL LOW (ref 3.87–5.11)
RDW: 14.2 % (ref 11.5–15.5)

## 2011-03-29 NOTE — Discharge Summary (Signed)
NAME:  Valerie Burke, Valerie Burke               ACCOUNT NO.:  [id number]   MEDICAL RECORD NO.:  [id number]          PATIENT TYPE:  INP   LOCATION:  9157                          FACILITY:  [facility]   PHYSICIAN:  Kendra H. Tenny Craw, MD     DATE OF BIRTH:  07/29/1976   DATE OF ADMISSION:  05/30/2009  DATE OF DISCHARGE:  05/31/2009                               DISCHARGE SUMMARY   HOSPITAL DIAGNOSES:  1. A 20-week and 6-day intrauterine pregnancy.  2. Vaginal bleeding, presumed partial placental abruption.   HOSPITAL PROCEDURES.:  Ultrasound.   HOSPITAL COURSE:  [name] is a 35 year old gravida 3, para 2-0-0-2,  presented on May 28, 2009, after experiencing abdominal cramping and  vaginal bleeding.  Please refer to the admission history and physical  for complete details of her presentation.  She was admitted to the  antepartum unit.  She did receive terbutaline 0.25 mg subcutaneously x1  for symptomatic cramping.  She had an ultrasound performed which  demonstrated a viable intrauterine fetus with the appropriate  gestational age measurements, posterior placenta with no evidence of  retroplacental clot and cervical length of 3.9 cm without funneling.  She remained on antepartum on bedrest and pad counts.  Her bleeding  diminished significantly to the point that at the time of discharge she  was only having some dark brown spotting when she wiped and was not  requiring a pad for the bleeding.  She was experiencing active fetal  movement, cramping had resolved and the decision was made to proceed  with discharge.   During her hospital course she was also having some problems with  headaches.  She does have a history of migraine headaches for which she  usually takes 1500 mg of Extra Strength Tylenol.  She was given Vicodin  and oxycodone with  eventual resolution of her headache.  She did  receive a Zofran for her nausea.  On the morning of discharge she has  had a good night's sleep.  Her headache was  completely resolved.   She will follow up in the office on June 08, 2009.  She will call in the  morning following discharge to get that appointment scheduled. She will  be discharged with prescriptions for Fioricet 1 to 2 p.o. q. 4-6 hours  p.r.n. headache, Ambien 10 mg 1  p.o. q.h.s. p.r.n. difficulty sleeping  and Zofran 4 mg ODT 1 p.o. q.4 hours as needed for nausea.  She has had  a previously scheduled follow-up anatomy ultrasound for June 01, 2009.  She will keep the appointment for this ultrasound as the poorly  visualized anatomy from her previous ultrasound was not well visualized  on the ultrasound that was performed on 07/17.  We will also reevaluate  the placenta for any evidence of retroplacental clot on that exam.      Kendra H. Tenny Craw, MD  Electronically Signed     KHR/MEDQ  D:  05/31/2009  T:  05/31/2009  Job:  [id number]

## 2011-03-29 NOTE — Op Note (Signed)
NAME:  Valerie Burke, Valerie Burke               ACCOUNT NO.:  [id number]   MEDICAL RECORD NO.:  [id number]          PATIENT TYPE:  AMB   LOCATION:  SDC                           FACILITY:  [facility]   PHYSICIAN:  Kendra H. Tenny Craw, MD     DATE OF BIRTH:  03/23/1976   DATE OF PROCEDURE:  10/02/2008  DATE OF DISCHARGE:                               OPERATIVE REPORT   PREOPERATIVE DIAGNOSES:  1. Menorrhagia.  2. Suspected endometrial polyp versus submucosal fibroid.   POSTOPERATIVE DIAGNOSES:  1. Menorrhagia.  2. Thickened endometrium.   PROCEDURE:  Hysteroscopy, dilation and curettage.   SURGEON:  Freddrick March. Tenny Craw, MD   ASSISTANT:  None.   ANESTHESIA:  General with LMA.   OPERATIVE FINDINGS:  Thickened benign appearing endometrium, a very  small submucosal fibroid was noted on the posterior aspect of the  uterus, however, this was not impinging into the endometrial cavity, and  this was not resected at this time, it was approximately 1 cm in size.  Normal tubal ostia were noted bilaterally.   SPECIMENS:  Endometrial curettings.   DISPOSITION:  Specimens to Pathology.   ESTIMATED BLOOD LOSS:  Minimal.   COMPLICATIONS:  None.   PROCEDURE:  [name] is a 35 year old G2, P2 who has had complaint of  menorrhagia for several months.  A transvaginal ultrasound was performed  in the office and demonstrated an asymmetrically thickened endometrial  lining measuring 1.6 x 2.18 cm. Its appearance was consistent with a  polyp.  The results of this were discussed with the patient and she  elected for surgical management.  Following the appropriate informed  consent, she was brought to the operating room where general anesthesia  with LMA was administered.  She was placed in the dorsal lithotomy  position, prepped and draped in the normal sterile fashion.  A speculum  was placed into the vagina.  A single-toothed tenaculum was used to  grasp the anterior lip of the cervix.  A paracervical block was  administered with 1% lidocaine plain, 10 mL were given.  The uterus was  sounded to 9 cm.  The cervix was serially dilated with Hanks dilators.  The hysteroscope was then introduced transcervically into the  endometrial cavity and demonstrated a benign appearing fluffy thickened  endometrium.  Tubal ostia were noted bilaterally.  No obvious polyps  were visualized.  The hysteroscope was removed and a sharp curettage was  performed with removal of a moderate amount of endometrial curettings.  The hysteroscope was then reintroduced and the endometrial cavity was  normal in appearance.  Upon the sharp curettage, it was felt that there  was a small submucosal fibroid on the posterior wall of the uterus.  Upon visualization with the second pass of the hysteroscope, this was  minimally impinging on the endometrial cavity and was not felt to  warrant resection.  This completed the procedure.  The hysteroscope was  removed and the single-toothed tenaculum was removed from the cervix.  The speculum was removed from the vagina, anesthesia was reversed, and  the patient was brought to the recovery room in  stable condition  following the procedure.      Freddrick March. Tenny Craw, MD  Electronically Signed     KHR/MEDQ  D:  10/02/2008  T:  10/03/2008  Job:  [id number]

## 2011-03-29 NOTE — H&P (Signed)
NAME:  Valerie Burke, Valerie Burke               ACCOUNT NO.:  [id number]   MEDICAL RECORD NO.:  [id number]          PATIENT TYPE:  INP   LOCATION:  9157                          FACILITY:  [facility]   PHYSICIAN:  Kendra H. Tenny Craw, MD     DATE OF BIRTH:  11/24/1975   DATE OF ADMISSION:  05/30/2009  DATE OF DISCHARGE:                              HISTORY & PHYSICAL   CHIEF COMPLAINT:  Vaginal bleeding.   HISTORY OF PRESENT ILLNESS:  Valerie Burke is a 35 year old, gravida 3, para  2-0-0-2 at 20 weeks and 5 days estimated gestational age, who presents  with a chief complaint of vaginal bleeding and cramping.  She states  that over the evening of July 16th,  she noticed increased uterine  cramping and discomfort.  When she went to the bathroom, she noticed a  significant amount of bleeding and decided to present to Maternity  Admissions for evaluation.  She endorsed active fetal movement and no  leaking of fluid.  She has had episodes of spotting during this  pregnancy especially postcoital spotting.  An ultrasound had previously  been performed for anatomy earlier in the pregnancy and was within  normal limits except for limited views of the cardiac anatomy.  A  followup ultrasound was also performed for some spotting, which  demonstrated no evidence of placenta previa.  Th patient denies any  trauma, new medications, heavy lifting, or recent intercourse.   PAST MEDICAL HISTORY:  1. Seasonal allergies.  2. Menorrhagia.   PAST SURGICAL HISTORY:  Hysteroscopy D and C for menorrhagia.   PAST OBSTETRIC HISTORY:  Gravida 3, para 2-0-0-2, status post 2  spontaneous full-term vaginal deliveries without complication.   PAST GYN HISTORY:  Significant for menorrhagia.   MEDICATIONS:  1. Prenatal vitamins.  2. Xyzal for seasonal allergies.   ALLERGIES:  No known drug allergies.   LABS:  Blood type A positive.  Antibody screen negative.  White blood  cell count 16.4, hemoglobin 10.7, platelet count 247.  PT,  PTT, INR  within normal limits.   PHYSICAL EXAMINATION:  VITAL SIGNS:  Afebrile, vital signs stable.  GENERAL:  Alert and oriented x3 in no apparent distress.  ABDOMEN:  Soft, nontender, and nondistended.  GU: Perineum is covered in port wine-colored blood.  On speculum exam  port wine-stained blood covering the vagina.  The cervix is visually  closed.  There is no active bleeding from the cervical os.  A slight  amount of pooling of blood in the posterior vagina.  Cervix is digitally  closed.  Amount of blood on the pad is consistent with a heavy period.  FHT: Fetal heart tones are in the 140s with good accelerations.  TOCO: toco is without evidence of contraction.   ULTRASOUND:  A single intrauterine pregnancy in the cephalic  presentation weighing 376 g, which is 47th percentile.  Amniotic fluid  volume is subjectively within normal limits.  Fetal heart tones are 147.  The placenta is posterior and above the cervical os.  No mention of  retroplacental clot or abruption is noted.  Cervical length is 3.9  cm  without evidence of funnel.   ASSESSMENT AND PLAN:  This is a 35 year old, gravida 3, para 2 at 20  weeks and 5 days with vaginal bleeding and presumed partial placental  abruption.  Will admit to the Antenatal Service for 23-hour observation  and monitoring of bleeding with a pad count.  The patient may have  bedrest with bathroom privileges.  Fetal heart tones every 4 hours.      Freddrick March. Tenny Craw, MD  Electronically Signed     KHR/MEDQ  D:  05/30/2009  T:  05/30/2009  Job:  [id number]

## 2011-04-01 NOTE — Discharge Summary (Signed)
   NAME:  Valerie Burke, Valerie Burke                         ACCOUNT NO.:  [id number]   MEDICAL RECORD NO.:  [id number]                   PATIENT TYPE:  INP   LOCATION:  9146                                 FACILITY:  [facility]   PHYSICIAN:  Gerrit Friends. Aldona Bar, M.D.                DATE OF BIRTH:  May 03, 1976   DATE OF ADMISSION:  06/16/2003  DATE OF DISCHARGE:  06/19/2003                                 DISCHARGE SUMMARY   DISCHARGE DIAGNOSES:  1. Term pregnancy delivered 7-pound 6-ounce female infant, Apgars 9 and 9.  2. Blood type A positive.  3. Equivocal rubella status.   PROCEDURES:  1. Normal spontaneous delivery.  2. Second degree tear and repair.  3. Rubella vaccine.   SUMMARY:  This 35 year old primigravida was admitted at term after rupture  of membranes earlier on the day of admission after an uncomplicated  pregnancy.   She labored and subsequently had a normal spontaneous delivery of a 7-pound  6-ounce female infant with Apgars of 9 and 9 over a second degree tear which  was repaired without difficulty.  Her postpartum course was benign.  Her  discharge hemoglobin was 9 with a white count of 15,000 and a platelet count  of 259,000.  On the morning of June 19, 2003 she was ambulating well,  tolerating a regular diet well, having normal bowel and bladder function,  was afebrile, her breast-feeding was going well, she was comfortable, her  vital signs were stable and she was deemed ready for discharge.  Accordingly, she was given all instructions and understood all instructions  well.   DISCHARGE MEDICATIONS:  Vitamins - one a day, ferrous sulfate 300 mg daily,  Motrin 600 mg every 6 hours as needed for pain, and Tylox one to two every 4-  6 hours as needed for severe pain.   FOLLOW UP:  She will return to the office for followup in approximately 4  weeks' time or as needed.   CONDITION ON DISCHARGE:  Improved.   ADDENDUM:  The patient will receive rubella vaccine prior to  discharge.                                               Gerrit Friends. Aldona Bar, M.D.    RMW/MEDQ  D:  06/19/2003  T:  06/19/2003  Job:  [id number]

## 2011-08-16 LAB — PROTIME-INR: Prothrombin Time: 13.1

## 2011-08-16 LAB — CBC
HCT: 36.3
Platelets: 311
RDW: 12.7
WBC: 11.2 — ABNORMAL HIGH

## 2011-08-16 LAB — TYPE AND SCREEN: ABO/RH(D): A POS

## 2011-08-16 LAB — ABO/RH: ABO/RH(D): A POS

## 2011-08-16 LAB — PREGNANCY, URINE: Preg Test, Ur: NEGATIVE

## 2011-08-16 LAB — APTT: aPTT: 29

## 2012-09-03 ENCOUNTER — Other Ambulatory Visit: Payer: Self-pay | Admitting: *Deleted

## 2013-07-22 ENCOUNTER — Other Ambulatory Visit: Payer: Self-pay | Admitting: Obstetrics and Gynecology

## 2014-12-15 LAB — HM PAP SMEAR: HM PAP: NORMAL

## 2016-09-19 ENCOUNTER — Telehealth: Payer: Self-pay | Admitting: General Practice

## 2016-09-19 ENCOUNTER — Ambulatory Visit: Payer: Self-pay | Admitting: Family Medicine

## 2016-09-19 ENCOUNTER — Telehealth: Payer: Self-pay | Admitting: Family Medicine

## 2016-09-19 ENCOUNTER — Telehealth: Payer: Self-pay | Admitting: Family

## 2016-09-19 ENCOUNTER — Encounter: Payer: Self-pay | Admitting: Family Medicine

## 2016-09-19 ENCOUNTER — Ambulatory Visit (INDEPENDENT_AMBULATORY_CARE_PROVIDER_SITE_OTHER): Payer: Managed Care, Other (non HMO) | Admitting: Family Medicine

## 2016-09-19 VITALS — BP 120/88 | HR 92 | Temp 98.5°F | Resp 12 | Ht 63.0 in | Wt 239.5 lb

## 2016-09-19 DIAGNOSIS — D509 Iron deficiency anemia, unspecified: Secondary | ICD-10-CM | POA: Diagnosis not present

## 2016-09-19 DIAGNOSIS — K219 Gastro-esophageal reflux disease without esophagitis: Secondary | ICD-10-CM

## 2016-09-19 DIAGNOSIS — R079 Chest pain, unspecified: Secondary | ICD-10-CM | POA: Diagnosis not present

## 2016-09-19 MED ORDER — OMEPRAZOLE 40 MG PO CPDR: 40.0000 mg | DELAYED_RELEASE_CAPSULE | Freq: Every day | ORAL | 0 refills | Status: DC

## 2016-09-19 NOTE — Progress Notes (Signed)
HPI:  ACUTE VISIT:  Chief Complaint  Patient presents with  . Hypertension  . Left arm numbness    Friday, chest pain    [name] Bollig is a 40 y.o. female, who is here today complaining of chest pain and LUE numbness sensation, She is not sure if they're probably related.    She also has noted left shoulder and neck pain for the past 3 days, she denies any injury, no limitation of range of motion. She cannot describe sensation on left UE, involved whole arm, no focal weakness but she is afraid of lifting something with left hand, feels like she might not be able to do so. Discomfort is mild and constant. She has not identified exacerbating or alleviating factors.   She started with chest pain on 09/16/16 night. It has had intermittent, left-sided, no radiated, not exertional, 3/10 in intensity, achy sensation. No associated diaphoresis, palpitation, dizziness, of dyspnea.  She has had some mild nonproductive cough, which she attributes to "sinus" issues. She has history of allergies.   She was taking OTC decongestants until 3 days ago when she stopped due to chest pain.  She thinks symptoms are related to stress.   - Denies Hx of GERD but sometimes certain food gets "stuck" in the middle of her chest, she is not taking any OTC medication.   Denies abdominal pain, nausea, vomiting, changes in bowel habits, blood in stool or melena.  She denies HTN,HLD,DM, or tobacco use. FHx negative for CAD. LMP: She is on IUD, "spotting" 1 weeks ago, reported as "normal" since IUD placed.  Hx of iron def anemia.  Lab Results  Component Value Date   WBC 12.5 (H) 04/20/2010   HGB 11.8 (L) 04/20/2010   HCT 36.2 04/20/2010   MCV 86.4 04/20/2010   PLT 319 04/20/2010   She is not on iron supplementation. She denies any gross hematuria. Denies pica. Denies heavy menses.   She does not follow a healthful diet consistently and does not exercise regularly.    Review of  Systems  Constitutional: Positive for fatigue. Negative for activity change, appetite change, diaphoresis, fever and unexpected weight change.  HENT: Negative for facial swelling, mouth sores, nosebleeds, sinus pressure, sore throat and voice change.   Respiratory: Positive for cough. Negative for chest tightness, shortness of breath and wheezing.   Cardiovascular: Positive for chest pain. Negative for palpitations and leg swelling.  Gastrointestinal: Negative for abdominal pain, blood in stool, nausea and vomiting.       Negative for changes in bowel habits.  Genitourinary: Negative for decreased urine volume, difficulty urinating and hematuria.  Musculoskeletal: Negative for gait problem and myalgias.  Skin: Negative for color change and rash.  Allergic/Immunologic: Positive for environmental allergies.  Neurological: Positive for numbness. Negative for syncope, weakness and headaches.  Hematological: Negative for adenopathy. Does not bruise/bleed easily.  Psychiatric/Behavioral: Negative for confusion. The patient is nervous/anxious.       No current outpatient prescriptions on file prior to visit.   No current facility-administered medications on file prior to visit.      Past Medical History:  Diagnosis Date  . Anemia    Not on File  Social History   Social History  . Marital status: Married    Spouse name: N/A  . Number of children: N/A  . Years of education: N/A   Social History Main Topics  . Smoking status: Never Smoker  . Smokeless tobacco: Never Used  . Alcohol  use Yes  . Drug use: No  . Sexual activity: Not Asked   Other Topics Concern  . None   Social History Narrative  . None    Vitals:   09/19/16 1530  BP: 120/88  Pulse: 92  Resp: 12  Temp: 98.5 F (36.9 C)    O2 sat at RA 98%  Body mass index is 42.43 kg/m.    Physical Exam  Nursing note and vitals reviewed. Constitutional: She is oriented to person, place, and time. She appears  well-developed. She does not appear ill. No distress.  HENT:  Head: Atraumatic.  Mouth/Throat: Oropharynx is clear and moist and mucous membranes are normal.  Eyes: Conjunctivae and EOM are normal. Pupils are equal, round, and reactive to light.  Neck: No JVD present. No tracheal deviation present. No thyroid mass and no thyromegaly present.  Cardiovascular: Normal rate and regular rhythm.   No murmur heard. Pulses:      Dorsalis pedis pulses are 2+ on the right side, and 2+ on the left side.  Respiratory: Effort normal and breath sounds normal. No respiratory distress. She exhibits no tenderness.  GI: Soft. She exhibits no mass. There is no hepatomegaly. There is no tenderness.  Musculoskeletal: She exhibits no edema or tenderness.  Neurological: She is alert and oriented to person, place, and time. She has normal strength. No sensory deficit. Coordination and gait normal.  Reflex Scores:      Bicep reflexes are 2+ on the right side and 2+ on the left side. Skin: Skin is warm. No erythema.  Psychiatric: Her speech is normal. Her mood appears anxious.  Well groomed, good eye contact.      ASSESSMENT AND PLAN:     Duwayne HeckDanielle was seen today for hypertension and left arm numbness.  Diagnoses and all orders for this visit:    Chest pain, unspecified type  We discussed possible causes: musculoskeletal,GERD,cardiac among some. Given CV risk factors the likelihood of this being cardiac related is low, I explained, still clearly instructed about warning signs. EKG today ? Sinus arrhythmia, otherwise negative. No other EKG for comparison.  F/U with PCP in 2-3 weeks, before if needed.  -     EKG 12-Lead -     CBC -     Basic metabolic panel  Iron deficiency anemia, unspecified iron deficiency anemia type  Further recommendations will be given according to lab results.  -     CBC -     Basic metabolic panel  Gastroesophageal reflux disease, esophagitis presence not  specified  Could certainly aggravate or cause chest pain. GERD precautions recommended. Omeprazole 40 mg x 3-4 weeks. F/U with PCP in 2-3 weeks.  -     omeprazole (PRILOSEC) 40 MG capsule; Take 1 capsule (40 mg total) by mouth daily.       Return in about 3 weeks (around 10/10/2016) for CP-GERD with PCP please.     -[name] Koppenhaver was advised to return or notify a doctor immediately if symptoms worsen or new concerns arise, she voices understanding.       Jayston Trevino G. [place], MD  [facility]. [place] office.

## 2016-09-19 NOTE — Patient Instructions (Signed)
  [name] I have seen you today for an acute visit.  1. Chest pain, unspecified type  ? Muscular skeletal, GERD, low likelihood of cardia pain but never zero.   - EKG 12-Lead - CBC - Basic metabolic panel  2. Iron deficiency anemia, unspecified iron deficiency anemia type  - CBC - Basic metabolic panel  3. Gastroesophageal reflux disease, esophagitis presence not specified    Avoid foods that make your symptoms worse, for example coffee, chocolate,pepermeint,alcohol, and greasy food. Raising the head of your bed about 6 inches may help with nocturnal symptoms.   Weight loss (if you are overweight). Avoid lying down for 3 hours after eating.  Instead 3 large meals daily try small and more frequent meals during the day.  Some medications we recommend for acid reflux treatment (proton pump inhibitors) can cause some problems in the long term: increase risk of osteoporosis, vitamin deficiencies,pneumonia, and more recently discovered that it can increase the risk of chronic kidney disease and cardiovascular problems but at this time benefit seems to be greater that risk  You should be evaluated immediately if bloody vomiting, bloody stools, black stools (like tar), difficulty swallowing, food gets stuck on the way down or choking when eating. Abnormal weight loss or severe abdominal pain.  If symptoms are not resolved sometimes endoscopy is necessary.     In general please monitor for signs of worsening symptoms and seek immediate medical attention if any concerning/warning symptom as we discussed.   Follow with PCP in 3 weeks.   Please be sure you have an appointment already scheduled with your PCP before you leave today.

## 2016-09-19 NOTE — Telephone Encounter (Signed)
Patient Name: Valerie BooksDANIELLE Burke  DOB: 02/24/1976    Initial Comment Caller says, for the last few days she high blood pressure. it was 139/105 earlier , her Lt arm feels numb,    Nurse Assessment  Nurse: Stefano GaulStringer, RN, Dwana CurdVera Date/Time (Eastern Time): 09/19/2016 2:30:25 PM  Confirm and document reason for call. If symptomatic, describe symptoms. You must click the next button to save text entered. ---Caller states she was 10 min [facility] and was told the doctor would be unable to see her. BP was 139/105. Still having the feeling in her left arm that it is asleep since Friday and then it tingles at times. no weakness in her left arm and she can use it.  Has the patient traveled out of the country within the last 30 days? ---Not Applicable  Does the patient have any new or worsening symptoms? ---Yes  Will a triage be completed? ---Yes  Related visit to physician within the last 2 weeks? ---No  Does the PT have any chronic conditions? (i.e. diabetes, asthma, etc.) ---No  Is the patient pregnant or possibly pregnant? (Ask all females between the ages of 4312-55) ---No  Is this a behavioral health or substance abuse call? ---No     Guidelines    Guideline Title Affirmed Question Affirmed Notes  Neurologic Deficit [1] Numbness (i.e., loss of sensation) of the face, arm / hand, or leg / foot on one side of the body AND [2] gradual onset (e.g., days to weeks) AND [3] present now    Final Disposition User   See Physician within 4 Hours (or PCP triage) Stefano GaulStringer, RN, Vera    Comments  [facility] Betty [place] at 3:30 pm 09/19/2016.   Referrals  REFERRED TO PCP OFFICE   Disagree/Comply: Comply

## 2016-09-19 NOTE — Telephone Encounter (Signed)
Patient arrived at [facility] at 1:40 for a 1:30 appointment, per Dr Claiborne BillingsKuneff, patient would need to reschedule or go to ED since she is a new patient and was only given a 15 minute appt.  Patient was advised but was not happy about it.

## 2016-09-19 NOTE — Telephone Encounter (Signed)
Patient called back to our office and explained what happened. She said she was still very upset about her BP and wanted to speak to a nurse. I sent her to [facility].

## 2016-09-19 NOTE — Telephone Encounter (Signed)
Patient Name: Valerie BooksDANIELLE Burke  DOB: 08/11/1976    Initial Comment Pt having blood pressure issues Bottom number has been high. 143/101 , 139/105    Nurse Assessment  Nurse: Stefano GaulStringer, RN, Vera Date/Time (Eastern Time): 09/19/2016 11:15:32 AM  Confirm and document reason for call. If symptomatic, describe symptoms. You must click the next button to save text entered. ---Caller states she has elevated blood pressure readings. BP 143/101 and 139/105. Does not take antihypertensives. Left arm feels like it falls asleep and then has some tingling which started on Friday.  Has the patient traveled out of the country within the last 30 days? ---Not Applicable  Does the patient have any new or worsening symptoms? ---Yes  Will a triage be completed? ---Yes  Related visit to physician within the last 2 weeks? ---No  Does the PT have any chronic conditions? (i.e. diabetes, asthma, etc.) ---Yes  List chronic conditions. ---allergies  Is the patient pregnant or possibly pregnant? (Ask all females between the ages of 3812-55) ---No  Is this a behavioral health or substance abuse call? ---No     Guidelines    Guideline Title Affirmed Question Affirmed Notes  Neurologic Deficit [1] Numbness (i.e., loss of sensation) of the face, arm / hand, or leg / foot on one side of the body AND [2] gradual onset (e.g., days to weeks) AND [3] present now    Final Disposition User   See Physician within 4 Hours (or PCP triage) Stefano GaulStringer, RN, Vera    Comments  [facility]. [facility] at 1:30 pm 09/19/2016 with Dr. Felix Pacinienee Kuneff   Referrals  REFERRED TO PCP OFFICE   Disagree/Comply: Comply

## 2016-09-19 NOTE — Telephone Encounter (Signed)
Patient called to establish care with one of our providers. Shows as Valerie Burke's patient but has never been seen. Scheduled to establish with Valerie RichtersEdward Burke. Had concerns about her BP and was transferred to [facility] for advice. Spoke with Phelps DodgeDeserea

## 2016-09-20 LAB — BASIC METABOLIC PANEL
BUN: 15 mg/dL (ref 6–23)
CALCIUM: 9.1 mg/dL (ref 8.4–10.5)
CHLORIDE: 102 meq/L (ref 96–112)
CO2: 30 meq/L (ref 19–32)
CREATININE: 0.87 mg/dL (ref 0.40–1.20)
GFR: 76.17 mL/min (ref >60.00)
Glucose, Bld: 110 mg/dL — ABNORMAL HIGH (ref 70–99)
Potassium: 3.9 mEq/L (ref 3.5–5.1)
SODIUM: 138 meq/L (ref 135–145)

## 2016-09-20 LAB — CBC
HEMATOCRIT: 36.3 % (ref 36.0–46.0)
HEMOGLOBIN: 12.6 g/dL (ref 12.0–15.0)
MCHC: 34.5 g/dL (ref 30.0–36.0)
MCV: 88 fl (ref 78.0–100.0)
Platelets: 356 10*3/uL (ref 150.0–400.0)
RBC: 4.13 Mil/uL (ref 3.87–5.11)
RDW: 13.3 % (ref 11.5–15.5)
WBC: 16.8 10*3/uL — AB (ref 4.0–10.5)

## 2016-09-23 ENCOUNTER — Telehealth: Payer: Self-pay | Admitting: Behavioral Health

## 2016-09-23 ENCOUNTER — Encounter: Payer: Self-pay | Admitting: Behavioral Health

## 2016-09-23 NOTE — Telephone Encounter (Signed)
Pre-Visit Call completed with patient and chart updated.   Pre-Visit Info documented in Specialty Comments under [employer].    

## 2016-09-25 ENCOUNTER — Encounter: Payer: Self-pay | Admitting: Family Medicine

## 2016-09-26 ENCOUNTER — Encounter: Payer: Self-pay | Admitting: Medical

## 2016-09-26 ENCOUNTER — Ambulatory Visit (INDEPENDENT_AMBULATORY_CARE_PROVIDER_SITE_OTHER): Payer: Managed Care, Other (non HMO) | Admitting: Medical

## 2016-09-26 VITALS — BP 120/80 | HR 72 | Temp 98.1°F | Ht 63.25 in | Wt 240.6 lb

## 2016-09-26 DIAGNOSIS — R0789 Other chest pain: Secondary | ICD-10-CM | POA: Diagnosis not present

## 2016-09-26 DIAGNOSIS — Z23 Encounter for immunization: Secondary | ICD-10-CM

## 2016-09-26 DIAGNOSIS — J3489 Other specified disorders of nose and nasal sinuses: Secondary | ICD-10-CM

## 2016-09-26 DIAGNOSIS — R200 Anesthesia of skin: Secondary | ICD-10-CM

## 2016-09-26 DIAGNOSIS — F411 Generalized anxiety disorder: Secondary | ICD-10-CM

## 2016-09-26 DIAGNOSIS — K219 Gastro-esophageal reflux disease without esophagitis: Secondary | ICD-10-CM | POA: Diagnosis not present

## 2016-09-26 MED ORDER — SERTRALINE HCL 25 MG PO TABS: 25.0000 mg | ORAL_TABLET | Freq: Every day | ORAL | 0 refills | Status: DC

## 2016-09-26 MED ORDER — FLUTICASONE PROPIONATE 50 MCG/ACT NA SUSP: 2.0000 | Freq: Every day | NASAL | 1 refills | Status: DC

## 2016-09-26 MED ORDER — AZITHROMYCIN 250 MG PO TABS: ORAL_TABLET | ORAL | 0 refills | Status: DC

## 2016-09-26 NOTE — Patient Instructions (Addendum)
For stress and anxiety will rx sertraline. Low dose first but may need to increase dose in near future.  For your atypical chest pain in recent past will refer to cardiologist. But if you get recurrent symptoms as before then ED evaluation.  If you get arm pain or swelling left side let me know will get upper extremity ultrasound.  If sensation numbness worsens may refer you for emg studies.  For sinus pressure and hx of allergies will rx flonase and azithromycin to cover for sinus infection.  For occasional abdomen pain. Possigle gerd. Healthy diet and use zantac otc.  Follow up in 2-3 weeks or as needed  Flu vaccine today.

## 2016-09-26 NOTE — Progress Notes (Signed)
Subjective:    Patient ID: Valerie Burke, female    DOB: 10/29/1976, 40 y.o.   MRN: [id number]  HPI   Pt in for first time.  Pt states  2 weekends ago she had some pain in her left arm, left jaw pain  left lower flank and left leg pain.(pain lasted for 2 days and then gradually subsided with no treatment) Pt states left arm felt  partial numbness as if extremity had fallen asleep . Pt ekg on September 19, 2016 was normal. Pt has association with stress with symptoms.  Pt parents and siblings heat disease. But maternal grandad had CHF and high cholesterol.(bypass and stroke)  For most part she only had above symptoms 2 weekends ago Her left arm still has vague numb feeling still. No mid neck pain. No weakness. Pt had work up with Dr. [place]. Her ekg was normal.  Pt has stressed at work, 3 children, and worries about friend whose husband is cheating on her good friend. On Friday before her symptoms was very stressed. Pt has been anxious for 2-3 months.   Pt has some occasional sensation of food getting stuck in esophagus area when swallowing. Pain just above epigastric area. This is very rare. Occurs with particular foods. She knows foods that will cause symptoms.  lmp-iud. Mirena.    Review of Systems  Constitutional: Negative for chills and fatigue.  HENT: Positive for sinus pain and sinus pressure. Negative for congestion and ear pain.        2 weeks ago pt was nasal congested. She feels better nasaly but sinus pressure.completely.   Hx of allergies. She states if she forgets to take zyrtec will get sinus infection.  Respiratory: Negative for cough, chest tightness, shortness of breath and wheezing.   Cardiovascular: Negative for chest pain and palpitations.       None presently. But some 2 weekends ago.  Gastrointestinal: Negative for abdominal distention, abdominal pain, diarrhea, nausea and vomiting.  Musculoskeletal: Negative for back pain.  Skin: Negative for rash.    Neurological: Negative for dizziness, seizures, speech difficulty, weakness and headaches.  Hematological: Negative for adenopathy. Does not bruise/bleed easily.  Psychiatric/Behavioral: Negative for agitation, confusion, dysphoric mood, sleep disturbance and suicidal ideas. The patient is nervous/anxious.     Past Medical History:  Diagnosis Date  . Allergy   . Anemia   . GERD (gastroesophageal reflux disease)      Social History   Social History  . Marital status: Married    Spouse name: N/A  . Number of children: N/A  . Years of education: N/A   Occupational History  . Not on file.   Social History Main Topics  . Smoking status: Never Smoker  . Smokeless tobacco: Never Used  . Alcohol use Yes  . Drug use: No  . Sexual activity: Not on file   Other Topics Concern  . Not on file   Social History Narrative  . No narrative on file    No past surgical history on file.  Family History  Problem Relation Age of Onset  . Hypertension Father   . Hypertension Paternal Uncle   . Heart disease Maternal Grandmother     Allergies  Allergen Reactions  . Levofloxacin Rash    No current outpatient prescriptions on file prior to visit.   No current facility-administered medications on file prior to visit.     BP 120/80 (BP Location: Left Arm, Patient Position: Sitting, Cuff Size: Normal)  Pulse 72   Temp 98.1 F (36.7 C) (Oral)   Ht 5' 3.25" (1.607 m)   Wt 240 lb 9.6 oz (109.1 kg)   LMP 09/12/2016 Comment: IUD  SpO2 98%   BMI 42.28 kg/m       Objective:   Physical Exam   General Mental Status- Alert. General Appearance- Not in acute distress.   Skin General: Color- Normal Color. Moisture- Normal Moisture.  Neck Carotid Arteries- Normal color. Moisture- Normal Moisture. No carotid bruits. No JVD.  Chest and Lung Exam Auscultation: Breath Sounds:-Normal.  Cardiovascular Auscultation:Rythm- Regular. Murmurs & Other Heart Sounds:Auscultation of  the heart reveals- No Murmurs.  Abdomen Inspection:-Inspeection Normal. Palpation/Percussion:Note:No mass. Palpation and Percussion of the abdomen reveal- Non Tender, Non Distended + BS, no rebound or guarding.    Neurologic Cranial Nerve exam:- CN III-XII intact(No nystagmus), symmetric smile. Drift Test:- No drift. Romberg Exam:- Negative.  Heal to Toe Gait exam:-Normal. Finger to Nose:- Normal/Intact Strength:- 5/5 equal and symmetric strength both upper and lower extremities. Left upper ext-sharp and dull discrimination intact.  Upper ext- symmetric bilaterally. No bruits in brachial areas. No swelling. No pain on palpation. Normal pulses.      Assessment & Plan:  For stress and anxiety will rx sertraline. Low dose first but may need to increase dose in near future.  For your atypical chest pain in recent past will refer to cardiologist. But if you get recurrent symptoms as before then ED evaluation.  If you get arm pain or swelling left side let me know will get upper extremity ultrasound.  If sensation numbness worsens may refer you for emg studies.  For sinus pressure and hx of allergies will rx flonase and azithromycin to cover for sinus infection.  For occasional abdomen pain. Possigle gerd. Healthy diet and use zantac otc.  Follow up in 2-3 weeks or as needed  Flu vaccine today.

## 2016-09-26 NOTE — Progress Notes (Signed)
Pre visit review using our clinic review tool, if applicable. No additional management support is needed unless otherwise documented below in the visit note. 

## 2016-09-30 ENCOUNTER — Encounter: Payer: Self-pay | Admitting: Cardiology

## 2016-10-03 ENCOUNTER — Ambulatory Visit (INDEPENDENT_AMBULATORY_CARE_PROVIDER_SITE_OTHER): Payer: Managed Care, Other (non HMO) | Admitting: Cardiology

## 2016-10-03 ENCOUNTER — Encounter: Payer: Self-pay | Admitting: Cardiology

## 2016-10-03 VITALS — BP 132/82 | HR 83 | Ht 63.0 in | Wt 239.8 lb

## 2016-10-03 DIAGNOSIS — R072 Precordial pain: Secondary | ICD-10-CM | POA: Diagnosis not present

## 2016-10-03 NOTE — Progress Notes (Signed)
   Referring: Esperanza RichtersEdward Saguier, PA-C  HPI: 40 year old female for evaluation of chest pain. 3 weeks ago the patient was "stressed". She developed pain in the left chest area radiating to her left shoulder. The pain was described as an aching sensation followed by mild pressure. It was continuous for 3 days. Not pleuritic, positional or exertional. Not related to food. No associated symptoms. She has had some chest pain since under stressful situations. She otherwise has dyspnea with extreme activities not routine activities. No orthopnea, PND or pedal edema. Because of the above we were asked to evaluate. Note no palpitations or syncope.x  Current Outpatient Prescriptions  Medication Sig Dispense Refill  . cetirizine (ZYRTEC) 10 MG tablet Take 10 mg by mouth daily.    . sertraline (ZOLOFT) 25 MG tablet Take 1 tablet (25 mg total) by mouth daily. 30 tablet 0   No current facility-administered medications for this visit.     Allergies  Allergen Reactions  . Levofloxacin Rash     Past Medical History:  Diagnosis Date  . Allergy   . Anemia   . Anxiety   . GERD (gastroesophageal reflux disease)     Past Surgical History:  Procedure Laterality Date  . No prior surgery      Social History   Social History  . Marital status: Married    Spouse name: N/A  . Number of children: 3  . Years of education: N/A   Occupational History  . Not on file.   Social History Main Topics  . Smoking status: Never Smoker  . Smokeless tobacco: Never Used  . Alcohol use Yes     Comment: 2-3 glasses of wine  . Drug use: No  . Sexual activity: No   Other Topics Concern  . Not on file   Social History Narrative  . No narrative on file    Family History  Problem Relation Age of Onset  . Hypertension Father   . Hypertension Paternal Uncle   . Heart disease Maternal Grandmother     ROS: no fevers or chills, productive cough, hemoptysis, dysphasia, odynophagia, melena, hematochezia,  dysuria, hematuria, rash, seizure activity, orthopnea, PND, pedal edema, claudication. Remaining systems are negative.  Physical Exam:   Blood pressure 132/82, pulse 83, height 5\' 3"  (1.6 m), weight 239 lb 12.8 oz (108.8 kg), last menstrual period 09/12/2016.  General:  Well developed/obese in NAD Skin warm/dry Patient not depressed No peripheral clubbing Back-normal HEENT-normal/normal eyelids Neck supple/normal carotid upstroke bilaterally; no bruits; no JVD; no thyromegaly chest - CTA/ normal expansion CV - RRR/normal S1 and S2; no murmurs, rubs or gallops;  PMI nondisplaced Abdomen -NT/ND, no HSM, no mass, + bowel sounds, no bruit 2+ femoral pulses, no bruits Ext-no edema, chords, 2+ DP Neuro-grossly nonfocal  ECG 09/19/2016-sinus rhythm with no ST changes.  ECG today-sinus rhythm at a rate of 83. No ST changes.  A/P  1Chest pain-symptoms atypical. Electrocardiogram with no ST changes. Plan exercise treadmill stratification.  2 obesity-we discussed the importance of diet and exercise.  3 H/O anxiety-per IM.   Olga MillersBrian Crenshaw, MD

## 2016-10-03 NOTE — Patient Instructions (Signed)
Medication Instructions:   NO CHANGE  Testing/Procedures:  Your physician has requested that you have an exercise tolerance test. For further information please visit [email or web address]. Please also follow instruction sheet, as given.    Follow-Up:  Your physician recommends that you schedule a follow-up appointment in: AS NEEDED PENDING TEST RESULTS    Exercise Stress Electrocardiogram An exercise stress electrocardiogram is a test to check how blood flows to your heart. It is done to find areas of poor blood flow. You will need to walk on a treadmill for this test. The electrocardiogram will record your heartbeat when you are at rest and when you are exercising. What happens before the procedure?  Do not have drinks with caffeine or foods with caffeine for 24 hours before the test, or as told by your doctor. This includes coffee, tea (even decaf tea), sodas, chocolate, and cocoa.  Follow your doctor's instructions about eating and drinking before the test.  Ask your doctor what medicines you should or should not take before the test. Take your medicines with water unless told by your doctor not to.  If you use an inhaler, bring it with you to the test.  Bring a snack to eat after the test.  Do not  smoke for 4 hours before the test.  Do not put lotions, powders, creams, or oils on your chest before the test.  Wear comfortable shoes and clothing. What happens during the procedure?  You will have patches put on your chest. Small areas of your chest may need to be shaved. Wires will be connected to the patches.  Your heart rate will be watched while you are resting and while you are exercising.  You will walk on the treadmill. The treadmill will slowly get faster to raise your heart rate.  The test will take about 1-2 hours. What happens after the procedure?  Your heart rate and blood pressure will be watched after the test.  You may return to your normal diet,  activities, and medicines or as told by your doctor. This information is not intended to replace advice given to you by your health care provider. Make sure you discuss any questions you have with your health care provider. Document Released: 04/18/2008 Document Revised: 06/29/2016 Document Reviewed: 07/08/2013 Elsevier Interactive Patient Education  2017 Elsevier Inc.    

## 2016-10-11 ENCOUNTER — Telehealth (HOSPITAL_COMMUNITY): Payer: Self-pay

## 2016-10-11 NOTE — Telephone Encounter (Signed)
Encounter complete. 

## 2016-10-12 ENCOUNTER — Ambulatory Visit (INDEPENDENT_AMBULATORY_CARE_PROVIDER_SITE_OTHER): Payer: Managed Care, Other (non HMO) | Admitting: Medical

## 2016-10-12 ENCOUNTER — Encounter: Payer: Self-pay | Admitting: Medical

## 2016-10-12 VITALS — BP 118/78 | HR 78 | Temp 98.1°F | Ht 63.0 in | Wt 237.8 lb

## 2016-10-12 DIAGNOSIS — Z0000 Encounter for general adult medical examination without abnormal findings: Secondary | ICD-10-CM | POA: Diagnosis not present

## 2016-10-12 DIAGNOSIS — Z1231 Encounter for screening mammogram for malignant neoplasm of breast: Secondary | ICD-10-CM

## 2016-10-12 DIAGNOSIS — Z23 Encounter for immunization: Secondary | ICD-10-CM | POA: Diagnosis not present

## 2016-10-12 DIAGNOSIS — Z113 Encounter for screening for infections with a predominantly sexual mode of transmission: Secondary | ICD-10-CM

## 2016-10-12 DIAGNOSIS — R319 Hematuria, unspecified: Secondary | ICD-10-CM

## 2016-10-12 DIAGNOSIS — L989 Disorder of the skin and subcutaneous tissue, unspecified: Secondary | ICD-10-CM

## 2016-10-12 DIAGNOSIS — Z1239 Encounter for other screening for malignant neoplasm of breast: Secondary | ICD-10-CM

## 2016-10-12 DIAGNOSIS — F411 Generalized anxiety disorder: Secondary | ICD-10-CM

## 2016-10-12 LAB — CBC WITH DIFFERENTIAL/PLATELET
BASOS PCT: 0.3 % (ref 0.0–3.0)
Basophils Absolute: 0 10*3/uL (ref 0.0–0.1)
EOS ABS: 0.2 10*3/uL (ref 0.0–0.7)
Eosinophils Relative: 1.7 % (ref 0.0–5.0)
HCT: 37.5 % (ref 36.0–46.0)
HEMOGLOBIN: 12.9 g/dL (ref 12.0–15.0)
Lymphocytes Relative: 27.5 % (ref 12.0–46.0)
Lymphs Abs: 3.2 10*3/uL (ref 0.7–4.0)
MCHC: 34.3 g/dL (ref 30.0–36.0)
MCV: 87.1 fl (ref 78.0–100.0)
MONO ABS: 0.7 10*3/uL (ref 0.1–1.0)
Monocytes Relative: 6.2 % (ref 3.0–12.0)
Neutro Abs: 7.4 10*3/uL (ref 1.4–7.7)
Neutrophils Relative %: 64.3 % (ref 43.0–77.0)
PLATELETS: 348 10*3/uL (ref 150.0–400.0)
RBC: 4.31 Mil/uL (ref 3.87–5.11)
RDW: 12.8 % (ref 11.5–15.5)
WBC: 11.6 10*3/uL — AB (ref 4.0–10.5)

## 2016-10-12 LAB — COMPREHENSIVE METABOLIC PANEL
ALBUMIN: 3.9 g/dL (ref 3.5–5.2)
ALT: 25 U/L (ref 0–35)
AST: 20 U/L (ref 0–37)
Alkaline Phosphatase: 77 U/L (ref 39–117)
BUN: 11 mg/dL (ref 6–23)
CHLORIDE: 103 meq/L (ref 96–112)
CO2: 30 meq/L (ref 19–32)
CREATININE: 0.67 mg/dL (ref 0.40–1.20)
Calcium: 9 mg/dL (ref 8.4–10.5)
GFR: 102.93 mL/min (ref >60.00)
Glucose, Bld: 95 mg/dL (ref 70–99)
Potassium: 3.9 mEq/L (ref 3.5–5.1)
SODIUM: 139 meq/L (ref 135–145)
Total Bilirubin: 0.5 mg/dL (ref 0.2–1.2)
Total Protein: 7.2 g/dL (ref 6.0–8.3)

## 2016-10-12 LAB — HIV ANTIBODY (ROUTINE TESTING W REFLEX): HIV 1&2 Ab, 4th Generation: NONREACTIVE

## 2016-10-12 LAB — POCT URINALYSIS DIPSTICK
Bilirubin, UA: NEGATIVE
Glucose, UA: NEGATIVE
Ketones, UA: NEGATIVE
Leukocytes, UA: NEGATIVE
Nitrite, UA: NEGATIVE
Spec Grav, UA: >=1.03
Urobilinogen, UA: 0.2
pH, UA: 6

## 2016-10-12 LAB — LIPID PANEL
CHOL/HDL RATIO: 4
Cholesterol: 160 mg/dL (ref 0–200)
HDL: 42.1 mg/dL (ref >39.00)
LDL Cholesterol: 101 mg/dL — ABNORMAL HIGH (ref 0–99)
NONHDL: 117.84
Triglycerides: 86 mg/dL (ref 0.0–149.0)
VLDL: 17.2 mg/dL (ref 0.0–40.0)

## 2016-10-12 LAB — TSH: TSH: 1.58 u[IU]/mL (ref 0.35–4.50)

## 2016-10-12 LAB — T4, FREE: Free T4: 0.76 ng/dL (ref 0.60–1.60)

## 2016-10-12 MED ORDER — AMOXICILLIN-POT CLAVULANATE 875-125 MG PO TABS: 1.0000 | ORAL_TABLET | Freq: Two times a day (BID) | ORAL | 0 refills | Status: DC

## 2016-10-12 MED ORDER — CLONAZEPAM 0.5 MG PO TABS: 0.5000 mg | ORAL_TABLET | Freq: Two times a day (BID) | ORAL | 0 refills | Status: DC | PRN

## 2016-10-12 NOTE — Progress Notes (Addendum)
Subjective:    Patient ID: Valerie Burke, female    DOB: 04/03/1976, 40 y.o.   MRN: [id number]  HPI  Pt in for follow up.  She is her for physical. Pt has had recent stress and anxiety for which I saw her on last visit. She states sertraline is helping but she want to know if I can give benzodiazapene for severe anxiety/panic episodes. She feels sometimes stress level/anxiety reaches that point.  Pt ok with getting tdap today.  Pt ok with hiv screen.  Pt is fasting today.  Pt has 3 children. Works part time, admits to poor diet, no smoke. Rare alcohol use.  Pt up to date on her papsmear. Pt had IUD placed one year ago.  Pt states gyn did not order mammogram. Pt will see gyn in February.     Review of Systems  Constitutional: Negative for chills, fatigue and fever.  HENT: Negative for congestion, drooling, hearing loss, mouth sores, rhinorrhea and sinus pain.   Respiratory: Negative for chest tightness, shortness of breath and wheezing.   Cardiovascular: Negative for chest pain and palpitations.  Gastrointestinal: Negative for abdominal pain.  Musculoskeletal: Negative for back pain and gait problem.  Neurological: Positive for headaches. Negative for dizziness, seizures, syncope, speech difficulty, weakness, light-headedness and numbness.       Pt states extreme stress and tension associated with ha. She was very anxious at that time. But no gross motor or sensnory function deficits.  Hematological: Negative for adenopathy. Does not bruise/bleed easily.  Psychiatric/Behavioral: Negative for behavioral problems, dysphoric mood and suicidal ideas. The patient is nervous/anxious.     Past Medical History:  Diagnosis Date  . Allergy   . Anemia   . Anxiety   . GERD (gastroesophageal reflux disease)      Social History   Social History  . Marital status: Married    Spouse name: N/A  . Number of children: 3  . Years of education: N/A   Occupational History  . Not on  file.   Social History Main Topics  . Smoking status: Never Smoker  . Smokeless tobacco: Never Used  . Alcohol use Yes     Comment: 2-3 glasses of wine  . Drug use: No  . Sexual activity: No   Other Topics Concern  . Not on file   Social History Narrative  . No narrative on file    Past Surgical History:  Procedure Laterality Date  . No prior surgery      Family History  Problem Relation Age of Onset  . Hypertension Father   . Hypertension Paternal Uncle   . Heart disease Maternal Grandmother     Allergies  Allergen Reactions  . Levofloxacin Rash    Current Outpatient Prescriptions on File Prior to Visit  Medication Sig Dispense Refill  . cetirizine (ZYRTEC) 10 MG tablet Take 10 mg by mouth daily.    . sertraline (ZOLOFT) 25 MG tablet Take 1 tablet (25 mg total) by mouth daily. 30 tablet 0   No current facility-administered medications on file prior to visit.     BP 118/78 (BP Location: Left Arm, Patient Position: Sitting, Cuff Size: Large)   Pulse 78   Temp 98.1 F (36.7 C) (Oral)   Ht 5\' 3"  (1.6 m)   Wt 237 lb 12.8 oz (107.9 kg)   LMP 09/12/2016 Comment: IUD  SpO2 98%   BMI 42.12 kg/m       Objective:   Physical Exam  General Mental Status- Alert. General Appearance- Not in acute distress.   Heent- sinus still faint pressure/tender. Lt tm- red.tm.   Skin General: Color- Normal Color. Moisture- Normal Moisture. No worrisome lesions on inspection. Particular on her back.  On left ear- new 8 mm irregular skin lesion.   Neck Carotid Arteries- Normal color. Moisture- Normal Moisture. No carotid bruits. No JVD.  Chest and Lung Exam Auscultation: Breath Sounds:-Normal.  Cardiovascular Auscultation:Rythm- Regular. Murmurs & Other Heart Sounds:Auscultation of the heart reveals- No Murmurs.  Abdomen Inspection:-Inspeection Normal. Palpation/Percussion:Note:No mass. Palpation and Percussion of the abdomen reveal- Non Tender, Non Distended +  BS, no rebound or guarding.    Neurologic Cranial Nerve exam:- CN III-XII intact(No nystagmus), symmetric smile. Drift Test:- No drift. Romberg Exam:- Negative.  Heal to Toe Gait exam:-Normal. Finger to Nose:- Normal/Intact Strength:- 5/5 equal and symmetric strength both upper and lower extremities.  Breast- pt deferred will get exam in feb with her gyn.      Assessment & Plan:  For your wellness exam will get lab, urine pcot and give tdap.  Order for mammogram placed. You can go downstairs and have test scheduled.  Will refer you to dermatologist for your left ear skin lesion.  For your anxiety continue the sertraline. Will rx low dose clonazepam and low number. Use clonazepam  sparingly. On follow up if using frequently will need to put you on contract and get uds.  For residual sinus pressure and lt OM will rx augmentin.  Follow up in one month or as needed  Jaley Yan, Ramon DredgeEdward, [place] JerseyPA-C

## 2016-10-12 NOTE — Progress Notes (Signed)
Pre visit review using our clinic review tool, if applicable. No additional management support is needed unless otherwise documented below in the visit note. 

## 2016-10-12 NOTE — Patient Instructions (Addendum)
For your wellness exam will get lab, urine pcot and give tdap.  Order for mammogram placed. You can go downstairs and have test scheduled.  Will refer you to dermatologist for your left ear skin lesion.  For your anxiety continue the sertraline. Will rx low dose clonazepam  and low number. Use clonazepam sparingly. On follow up if using frequently will need to put you on contract and get uds.  For residual sinus pressure and lt OM will rx augmentin.  Follow up in one month or as needed   Preventive Care 18-39 Years, Female Preventive care refers to lifestyle choices and visits with your health care provider that can promote health and wellness. What does preventive care include?  A yearly physical exam. This is also called an annual well check.  Dental exams once or twice a year.  Routine eye exams. Ask your health care provider how often you should have your eyes checked.  Personal lifestyle choices, including:  Daily care of your teeth and gums.  Regular physical activity.  Eating a healthy diet.  Avoiding tobacco and drug use.  Limiting alcohol use.  Practicing safe sex.  Taking vitamin and mineral supplements as recommended by your health care provider. What happens during an annual well check? The services and screenings done by your health care provider during your annual well check will depend on your age, overall health, lifestyle risk factors, and family history of disease. Counseling  Your health care provider may ask you questions about your:  Alcohol use.  Tobacco use.  Drug use.  Emotional well-being.  Home and relationship well-being.  Sexual activity.  Eating habits.  Work and work Statistician.  Method of birth control.  Menstrual cycle.  Pregnancy history. Screening  You may have the following tests or measurements:  Height, weight, and BMI.  Diabetes screening. This is done by checking your blood sugar (glucose) after you have not  eaten for a while (fasting).  Blood pressure.  Lipid and cholesterol levels. These may be checked every 5 years starting at age 29.  Skin check.  Hepatitis C blood test.  Hepatitis B blood test.  Sexually transmitted disease (STD) testing.  BRCA-related cancer screening. This may be done if you have a family history of breast, ovarian, tubal, or peritoneal cancers.  Pelvic exam and Pap test. This may be done every 3 years starting at age 66. Starting at age 49, this may be done every 5 years if you have a Pap test in combination with an HPV test. Discuss your test results, treatment options, and if necessary, the need for more tests with your health care provider. Vaccines  Your health care provider may recommend certain vaccines, such as:  Influenza vaccine. This is recommended every year.  Tetanus, diphtheria, and acellular pertussis (Tdap, Td) vaccine. You may need a Td booster every 10 years.  Varicella vaccine. You may need this if you have not been vaccinated.  HPV vaccine. If you are 66 or younger, you may need three doses over 6 months.  Measles, mumps, and rubella (MMR) vaccine. You may need at least one dose of MMR. You may also need a second dose.  Pneumococcal 13-valent conjugate (PCV13) vaccine. You may need this if you have certain conditions and were not previously vaccinated.  Pneumococcal polysaccharide (PPSV23) vaccine. You may need one or two doses if you smoke cigarettes or if you have certain conditions.  Meningococcal vaccine. One dose is recommended if you are age 63-21 years and  a Market researcher living in a residence hall, or if you have one of several medical conditions. You may also need additional booster doses.  Hepatitis A vaccine. You may need this if you have certain conditions or if you travel or work in places where you may be exposed to hepatitis A.  Hepatitis B vaccine. You may need this if you have certain conditions or if you  travel or work in places where you may be exposed to hepatitis B.  Haemophilus influenzae type b (Hib) vaccine. You may need this if you have certain risk factors. Talk to your health care provider about which screenings and vaccines you need and how often you need them. This information is not intended to replace advice given to you by your health care provider. Make sure you discuss any questions you have with your health care provider. Document Released: 12/27/2001 Document Revised: 07/20/2016 Document Reviewed: 09/01/2015 Elsevier Interactive Patient Education  2017 [employer].

## 2016-10-13 ENCOUNTER — Ambulatory Visit (HOSPITAL_COMMUNITY)
Admission: RE | Admit: 2016-10-13 | Discharge: 2016-10-13 | Disposition: A | Discharge status: Home / Self Care | Payer: Managed Care, Other (non HMO) | Source: Ambulatory Visit | Attending: Cardiology | Admitting: Cardiology

## 2016-10-13 DIAGNOSIS — R072 Precordial pain: Secondary | ICD-10-CM | POA: Insufficient documentation

## 2016-10-13 LAB — EXERCISE TOLERANCE TEST
CHL CUP RESTING HR STRESS: 71 {beats}/min
CHL RATE OF PERCEIVED EXERTION: 17
CSEPED: 6 min
CSEPEW: 7.9 METS
CSEPPHR: 166 {beats}/min
Exercise duration (sec): 38 s
MPHR: 179 {beats}/min
Percent HR: 92 %

## 2016-10-13 LAB — URINE CULTURE: ORGANISM ID, BACTERIA: NO GROWTH

## 2016-10-18 ENCOUNTER — Ambulatory Visit (HOSPITAL_BASED_OUTPATIENT_CLINIC_OR_DEPARTMENT_OTHER)
Admission: RE | Admit: 2016-10-18 | Discharge: 2016-10-18 | Disposition: A | Discharge status: Home / Self Care | Payer: Managed Care, Other (non HMO) | Source: Ambulatory Visit | Attending: Medical | Admitting: Medical

## 2016-10-18 DIAGNOSIS — Z1239 Encounter for other screening for malignant neoplasm of breast: Secondary | ICD-10-CM

## 2016-10-18 DIAGNOSIS — Z1231 Encounter for screening mammogram for malignant neoplasm of breast: Secondary | ICD-10-CM | POA: Insufficient documentation

## 2016-10-25 ENCOUNTER — Other Ambulatory Visit: Payer: Self-pay | Admitting: Medical

## 2016-11-11 ENCOUNTER — Ambulatory Visit (INDEPENDENT_AMBULATORY_CARE_PROVIDER_SITE_OTHER): Payer: Managed Care, Other (non HMO) | Admitting: Medical

## 2016-11-11 VITALS — BP 122/65 | HR 81 | Temp 98.4°F | Ht 63.0 in | Wt 238.2 lb

## 2016-11-11 DIAGNOSIS — J3489 Other specified disorders of nose and nasal sinuses: Secondary | ICD-10-CM

## 2016-11-11 DIAGNOSIS — F411 Generalized anxiety disorder: Secondary | ICD-10-CM

## 2016-11-11 DIAGNOSIS — R0683 Snoring: Secondary | ICD-10-CM | POA: Diagnosis not present

## 2016-11-11 MED ORDER — SERTRALINE HCL 25 MG PO TABS: 25.0000 mg | ORAL_TABLET | Freq: Every day | ORAL | 5 refills | Status: DC

## 2016-11-11 NOTE — Progress Notes (Signed)
Pre visit review using our clinic tool,if applicable. No additional management support is needed unless otherwise documented below in the visit note.  

## 2016-11-11 NOTE — Progress Notes (Signed)
Subjective:    Patient ID: Valerie Burke, female    DOB: 05/12/1976, 40 y.o.   MRN: [id number]  HPI  Pt in for follow up.   Pt mentioned some anxiety on CPE. She states sertraline seems to help. She states she realizes that when she gets stressed and anxious her jaws tighten. Pt states she has used clonazepam only 2-3 times to high level anxiety. It worked and her anxiety did not escalate.   Pt states her sinus pressure and left ear pressure resolved with augmentin.  Pt states some tired/fatigue. All of her family states she snores. Pt never feels well rested in am when she awakes. Pt labs looked good on review of cpe exam. Snored since her 20's.  lmp- iud.    Review of Systems  Constitutional: Negative for chills, fatigue and fever.  HENT: Negative for congestion and ear pain.   Respiratory: Negative for cough, chest tightness, shortness of breath and wheezing.   Cardiovascular: Negative for chest pain and palpitations.  Gastrointestinal: Negative for abdominal pain, constipation, nausea and vomiting.  Neurological: Negative for dizziness and headaches.  Psychiatric/Behavioral: Positive for sleep disturbance. Negative for agitation, behavioral problems, decreased concentration, hallucinations and suicidal ideas. The patient is nervous/anxious.        Snores    Past Medical History:  Diagnosis Date  . Allergy   . Anemia   . Anxiety   . GERD (gastroesophageal reflux disease)      Social History   Social History  . Marital status: Married    Spouse name: N/A  . Number of children: 3  . Years of education: N/A   Occupational History  . Not on file.   Social History Main Topics  . Smoking status: Never Smoker  . Smokeless tobacco: Never Used  . Alcohol use Yes     Comment: 2-3 glasses of wine  . Drug use: No  . Sexual activity: No   Other Topics Concern  . Not on file   Social History Narrative  . No narrative on file    Past Surgical History:  Procedure  Laterality Date  . No prior surgery      Family History  Problem Relation Age of Onset  . Hypertension Father   . Hypertension Paternal Uncle   . Heart disease Maternal Grandmother     Allergies  Allergen Reactions  . Levofloxacin Rash    Current Outpatient Prescriptions on File Prior to Visit  Medication Sig Dispense Refill  . clonazePAM (KLONOPIN) 0.5 MG tablet Take 1 tablet (0.5 mg total) by mouth 2 (two) times daily as needed for anxiety. 14 tablet 0  . sertraline (ZOLOFT) 25 MG tablet TAKE 1 TABLET(25 MG) BY MOUTH DAILY 30 tablet 0  . cetirizine (ZYRTEC) 10 MG tablet Take 10 mg by mouth daily.     No current facility-administered medications on file prior to visit.     BP 122/65   Pulse 81   Temp 98.4 F (36.9 C) (Oral)   Ht 5\' 3"  (1.6 m)   Wt 238 lb 3.2 oz (108 kg)   SpO2 99%   BMI 42.20 kg/m       Objective:   Physical Exam  General  Mental Status - Alert. General Appearance - Well groomed. Not in acute distress. Good affect  Skin Rashes- No Rashes.  HEENT Head- Normal. Ear Auditory Canal - Left- Normal. Right - Normal.Tympanic Membrane- Left- Normal. Right- Normal. Eye Sclera/Conjunctiva- Left- Normal. Right- Normal. Nose &  Sinuses Nasal Mucosa- Left-  Boggy and Congested. Right-  Boggy and  Congested.Bilateral  No maxillary and no  frontal sinus pressure. Mouth & Throat Lips: Upper Lip- Normal: no dryness, cracking, pallor, cyanosis, or vesicular eruption. Lower Lip-Normal: no dryness, cracking, pallor, cyanosis or vesicular eruption. Buccal Mucosa- Bilateral- No Aphthous ulcers. Oropharynx- No Discharge or Erythema. Tonsils: Characteristics- Bilateral- No Erythema or Congestion. Size/Enlargement- Bilateral- No enlargement. Discharge- bilateral-None.  Neck Neck- Supple. No Masses.   Chest and Lung Exam Auscultation: Breath Sounds:-Clear even and unlabored.  Cardiovascular Auscultation:Rythm- Regular, rate and rhythm. Murmurs & Other Heart  Sounds:Ausculatation of the heart reveal- No Murmurs.  Lymphatic Head & Neck General Head & Neck Lymphatics: Bilateral: Description- No Localized lymphadenopathy.       Assessment & Plan:  For your anxiety continue the sertraline and klonopin if needed. Your use of benzo is very rare and sparing. If use increases then will get you to sign contract and give UDS. But at 2 tab a month will not require presently.  For snoring will refer you to pulmonologist.  Sinus pressure resolved now. If recurrent let us know.  Follow up in 3 months or as needed  Lynnie Koehler, Ramon DredgeEdward, VF CorporationPA-C

## 2016-11-11 NOTE — Patient Instructions (Signed)
For your anxiety continue the sertraline and klonopin if needed. Your use of benzo is very rare and sparing. If use increases then will get you to sign contract and give UDS. But at 2 tab a month will not require presently.  For snoring will refer you to pulmonologist.  Sinus pressure resolved now. If recurrent let us know.  Follow up in 3 months or as needed

## 2016-12-26 ENCOUNTER — Encounter: Payer: Self-pay | Admitting: Pulmonary Disease

## 2016-12-26 ENCOUNTER — Ambulatory Visit (INDEPENDENT_AMBULATORY_CARE_PROVIDER_SITE_OTHER): Payer: Managed Care, Other (non HMO) | Admitting: Pulmonary Disease

## 2016-12-26 VITALS — BP 122/82 | HR 83 | Ht 63.0 in | Wt 241.2 lb

## 2016-12-26 DIAGNOSIS — J351 Hypertrophy of tonsils: Secondary | ICD-10-CM

## 2016-12-26 DIAGNOSIS — J309 Allergic rhinitis, unspecified: Secondary | ICD-10-CM | POA: Diagnosis not present

## 2016-12-26 DIAGNOSIS — R0683 Snoring: Secondary | ICD-10-CM

## 2016-12-26 DIAGNOSIS — Z6841 Body Mass Index (BMI) 40.0 and over, adult: Secondary | ICD-10-CM

## 2016-12-26 MED ORDER — MONTELUKAST SODIUM 10 MG PO TABS: 10.0000 mg | ORAL_TABLET | Freq: Every day | ORAL | 5 refills | Status: DC

## 2016-12-26 NOTE — Progress Notes (Signed)
   Subjective:    Patient ID: Valerie Burke, female    DOB: 09/22/1976, 41 y.o.   MRN: [id number]  HPI    Review of Systems  Constitutional: Negative for fever and unexpected weight change.  HENT: Positive for postnasal drip and sinus pressure. Negative for congestion, dental problem, ear pain, nosebleeds, rhinorrhea, sneezing, sore throat and trouble swallowing.   Eyes: Negative for redness and itching.  Respiratory: Negative for cough, chest tightness, shortness of breath and wheezing.   Cardiovascular: Negative for palpitations and leg swelling.  Gastrointestinal: Negative for nausea and vomiting.  Genitourinary: Negative for dysuria.  Musculoskeletal: Negative for joint swelling.  Skin: Negative for rash.  Neurological: Negative for headaches.  Hematological: Does not bruise/bleed easily.  Psychiatric/Behavioral: Negative for dysphoric mood. The patient is not nervous/anxious.        Objective:   Physical Exam        Assessment & Plan:

## 2016-12-26 NOTE — Progress Notes (Signed)
Past Surgical History She  has a past surgical history that includes No prior surgery and Wisdom tooth extraction.  Allergies  Allergen Reactions  . Levofloxacin Rash    Family History Her family history includes Heart disease in her maternal grandmother; Hypertension in her father and paternal uncle.  Social History She  reports that she has never smoked. She has never used smokeless tobacco. She reports that she drinks alcohol. She reports that she does not use drugs.  Review of systems Constitutional: Negative for fever and unexpected weight change.  HENT: Positive for postnasal drip and sinus pressure. Negative for congestion, dental problem, ear pain, nosebleeds, rhinorrhea, sneezing, sore throat and trouble swallowing.   Eyes: Negative for redness and itching.  Respiratory: Negative for cough, chest tightness, shortness of breath and wheezing.   Cardiovascular: Negative for palpitations and leg swelling.  Gastrointestinal: Negative for nausea and vomiting.  Genitourinary: Negative for dysuria.  Musculoskeletal: Negative for joint swelling.  Skin: Negative for rash.  Neurological: Negative for headaches.  Hematological: Does not bruise/bleed easily.  Psychiatric/Behavioral: Negative for dysphoric mood. The patient is not nervous/anxious.     Current Outpatient Prescriptions on File Prior to Visit  Medication Sig  . cetirizine (ZYRTEC) 10 MG tablet Take 10 mg by mouth daily.  . clonazePAM (KLONOPIN) 0.5 MG tablet Take 1 tablet (0.5 mg total) by mouth 2 (two) times daily as needed for anxiety.  . sertraline (ZOLOFT) 25 MG tablet Take 1 tablet (25 mg total) by mouth daily. (Patient not taking: Reported on 12/26/2016)   No current facility-administered medications on file prior to visit.     Chief Complaint  Patient presents with  . SLEEP CONSULT    Pt referred by Dr Alvira Monday. Epworth Score: 16    Past medical history She  has a past medical history of Allergy; Anemia;  Anxiety; and GERD (gastroesophageal reflux disease).  Vital signs BP 122/82 (BP Location: Left Arm, Cuff Size: Normal)   Pulse 83   Ht 5\' 3"  (1.6 m)   Wt 241 lb 3.2 oz (109.4 kg)   SpO2 98%   BMI 42.73 kg/m   History of Present Illness Valerie Burke is a 41 y.o. female for evaluation of sleep problems.  She has noticed trouble with her sleep for years.  She is always tired.  She snores, and has been told she stops breathing.  She will wake up hearing herself snore.  She can't sleep on her back, and her mouth gets dry at night.  She goes to sleep at 10 pm.  She falls asleep after about 30 minutes.  She will play solitaire on her phone until she feels sleepy.  She wakes up 1 or 2 times during the night.  She gets out of bed at 645 am to get her kids to school.  She will come home and sleep for another 45 minutes.  She feels tired in the morning.  She denies morning headache.  She does not use anything to help her fall sleep.  She drinks 3 to 4 cans of coke per day.  She used to clench her teeth.  She denies sleep walking, sleep talking, bruxism, or nightmares.  There is no history of restless legs.  She denies sleep hallucinations, sleep paralysis, or cataplexy.  The Epworth score is 16 out of 24.  She has trouble with her sinuses all the time.  She feels like her throat is always swollen.  She used to get allergy shots with Dr.  Bromley.  She was seen by ENT years ago, and told she need sinus and tonsil surgery.  Her mother had sleep apnea and was on CPAP.  She had bariatric surgery and stopped using CPAP.  Physical Exam:  General - No distress ENT - No sinus tenderness, no oral exudate, no LAN, no thyromegaly, TM clear, pupils equal/reactive, 2+ tonsils, elongated uvula, MP 4 Cardiac - s1s2 regular, no murmur, pulses symmetric Chest - No wheeze/rales/dullness, good air entry, normal respiratory excursion Back - No focal tenderness Abd - Soft, non-tender, no organomegaly, + bowel  sounds Ext - No edema Neuro - Normal strength, cranial nerves intact Skin - No rashes Psych - Normal mood, and behavior  Discussion: She has snoring, sleep disruption, witnessed apnea, and daytime sleepiness.  Her BMI is > 35.  She has family history of sleep apnea.  I am concerned she could have sleep apnea also.  We discussed how sleep apnea can affect various health problems, including risks for hypertension, cardiovascular disease, and diabetes.  We also discussed how sleep disruption can increase risks for accidents, such as while driving.  Weight loss as a means of improving sleep apnea was also reviewed.  Additional treatment options discussed were CPAP therapy, oral appliance, and surgical intervention.  She also reports persistent allergies, and was previously on allergy shots.  She has tonsillar hypertrophy, and this could be contributing to her sleep issues.  Assessment/plan:  Snoring with concern for obstructive sleep apnea. - will arrange for home sleep study  Allergic rhinitis with tonsillar hypertrophy. - will have her try singulair - continue zyrtec - she is reluctant to use nose sprays - might need further allergy or ENT evaluation  Obesity. - discussed options to assist with weight loss   Patient Instructions  Singulair (montelukast) 10 mg pill nightly  Will arrange for home sleep study and call to schedule follow up after home sleep study reviewed    Coralyn HellingVineet Marqui Formby, M.D. Pager [phone] 12/26/2016, 3:27 PM

## 2016-12-26 NOTE — Patient Instructions (Signed)
Singulair (montelukast) 10 mg pill nightly  Will arrange for home sleep study and call to schedule follow up after home sleep study reviewed

## 2017-01-05 ENCOUNTER — Telehealth: Payer: Self-pay | Admitting: Medical

## 2017-01-05 ENCOUNTER — Encounter: Payer: Self-pay | Admitting: Medical

## 2017-01-05 ENCOUNTER — Ambulatory Visit (INDEPENDENT_AMBULATORY_CARE_PROVIDER_SITE_OTHER): Payer: Managed Care, Other (non HMO) | Admitting: Medical

## 2017-01-05 VITALS — BP 129/83 | HR 89 | Temp 98.4°F | Ht 65.3 in | Wt 240.0 lb

## 2017-01-05 DIAGNOSIS — H9202 Otalgia, left ear: Secondary | ICD-10-CM

## 2017-01-05 DIAGNOSIS — J0100 Acute maxillary sinusitis, unspecified: Secondary | ICD-10-CM

## 2017-01-05 DIAGNOSIS — R591 Generalized enlarged lymph nodes: Secondary | ICD-10-CM | POA: Diagnosis not present

## 2017-01-05 DIAGNOSIS — J029 Acute pharyngitis, unspecified: Secondary | ICD-10-CM | POA: Diagnosis not present

## 2017-01-05 LAB — POCT RAPID STREP A (OFFICE): RAPID STREP A SCREEN: NEGATIVE

## 2017-01-05 MED ORDER — FLUTICASONE PROPIONATE 50 MCG/ACT NA SUSP: 2.0000 | Freq: Every day | NASAL | 1 refills | Status: DC

## 2017-01-05 MED ORDER — AMOXICILLIN-POT CLAVULANATE 875-125 MG PO TABS: 1.0000 | ORAL_TABLET | Freq: Two times a day (BID) | ORAL | 0 refills | Status: DC

## 2017-01-05 NOTE — Telephone Encounter (Signed)
Referral faxed again to [facility], awaiting appt

## 2017-01-05 NOTE — Patient Instructions (Addendum)
For sinus infection, left ear infection and possible strep will rx augmentin antibiotic. Rt side neck lymph node should gradually resolve /flare down.  For pain in areas use ibuprofen.  Flonase for nasal congestion.  If you faint body aches worsen by tomorrow notify me.  Follow up in 7 days or as needed

## 2017-01-05 NOTE — Progress Notes (Signed)
Pre visit review using our clinic tool,if applicable. No additional management support is needed unless otherwise documented below in the visit note.  

## 2017-01-05 NOTE — Progress Notes (Signed)
Subjective:    Patient ID: Valerie Burke, female    DOB: 10/06/1976, 41 y.o.   MRN: [id number]  HPI    Pt in with some moderate to severe rt submandibular to palpation since yesterday. Pt daughter has strep recently. Daughter had + test last night. No pain on swallowing. Some recent sinus pain and hx of sinus infection. Faint rt upper teeth pain.  Review of Systems  Constitutional: Negative for fatigue and fever.  HENT: Positive for ear pain and sore throat. Negative for congestion, drooling, sinus pain and sinus pressure.   Respiratory: Negative for cough, choking, shortness of breath and wheezing.   Cardiovascular: Negative for chest pain and palpitations.  Gastrointestinal: Negative for abdominal pain.  Genitourinary: Negative for dysuria and flank pain.  Musculoskeletal: Negative for back pain.       Achiness last night. But today achiness feels better.  Neurological: Negative for dizziness and headaches.  Hematological: Positive for adenopathy. Does not bruise/bleed easily.  Psychiatric/Behavioral: Negative for behavioral problems and confusion. Hyperactive:      Past Medical History:  Diagnosis Date  . Allergy   . Anemia   . Anxiety   . GERD (gastroesophageal reflux disease)      Social History   Social History  . Marital status: Married    Spouse name: N/A  . Number of children: 3  . Years of education: N/A   Occupational History  . part time    Social History Main Topics  . Smoking status: Never Smoker  . Smokeless tobacco: Never Used  . Alcohol use Yes     Comment: 2-3 glasses of wine  . Drug use: No  . Sexual activity: No   Other Topics Concern  . Not on file   Social History Narrative  . No narrative on file    Past Surgical History:  Procedure Laterality Date  . No prior surgery    . WISDOM TOOTH EXTRACTION     age 62    Family History  Problem Relation Age of Onset  . Hypertension Father   . Hypertension Paternal Uncle   . Heart  disease Maternal Grandmother     Allergies  Allergen Reactions  . Levofloxacin Rash    Current Outpatient Prescriptions on File Prior to Visit  Medication Sig Dispense Refill  . cetirizine (ZYRTEC) 10 MG tablet Take 10 mg by mouth daily.    . clonazePAM (KLONOPIN) 0.5 MG tablet Take 1 tablet (0.5 mg total) by mouth 2 (two) times daily as needed for anxiety. 14 tablet 0  . montelukast (SINGULAIR) 10 MG tablet Take 1 tablet (10 mg total) by mouth at bedtime. 30 tablet 5  . sertraline (ZOLOFT) 25 MG tablet Take 1 tablet (25 mg total) by mouth daily. (Patient not taking: Reported on 01/05/2017) 30 tablet 5   No current facility-administered medications on file prior to visit.     BP 129/83   Pulse 89   Temp 98.4 F (36.9 C) (Oral)   Ht 5' 5.3" (1.659 m)   Wt 240 lb (108.9 kg)   SpO2 100%   BMI 39.57 kg/m      Objective:   Physical Exam  General  Mental Status - Alert. General Appearance - Well groomed. Not in acute distress.  Skin Rashes- No Rashes.  HEENT Head- Normal. Ear Auditory Canal - Left- Normal. Right - Normal.Tympanic Membrane- Left- red moderate. Right- Normal. Eye Sclera/Conjunctiva- Left- Normal. Right- Normal. Nose & Sinuses Nasal Mucosa- Left-  Boggy  and Congested. Right-  Boggy and  Congested.Bilateral maxillary and frontal sinus pressure. Mouth & Throat Lips: Upper Lip- Normal: no dryness, cracking, pallor, cyanosis, or vesicular eruption. Lower Lip-Normal: no dryness, cracking, pallor, cyanosis or vesicular eruption. Buccal Mucosa- Bilateral- No Aphthous ulcers. Oropharynx- No Discharge or Erythema. Tonsils: Characteristics- Bilateral- mild  Erythema and Congestion. Size/Enlargement- Bilateral- No enlargement. Discharge- bilateral-None.  Neck Neck- Supple. No Masses.   Chest and Lung Exam Auscultation: Breath Sounds:-Clear even and unlabored.  Cardiovascular Auscultation:Rythm- Regular, rate and rhythm. Murmurs & Other Heart  Sounds:Ausculatation of the heart reveal- No Murmurs.  Lymphatic Head & Neck General Head & Neck Lymphatics: Bilateral: Description- Moderate enlarged and tender submandibular node.       Assessment & Plan:  For sinus infection, left ear infection and possible strep will rx augmentin antibiotic. Rt side neck lymph node should gradually resolve /flare down.  For pain in areas use ibuprofen.  Flonase for nasal congestion.  If you faint body aches worsen by tomorrow notify me.  Follow up in 7 days or as needed  Wylan Gentzler, Ramon DredgeEdward, [employer] CorporationPA-C

## 2017-01-05 NOTE — Telephone Encounter (Signed)
Please see nov derm referral. Reactivate. Pt never got called?? Will you call and get her in quick

## 2017-01-07 LAB — CULTURE, GROUP A STREP

## 2017-02-01 DIAGNOSIS — G4733 Obstructive sleep apnea (adult) (pediatric): Secondary | ICD-10-CM | POA: Diagnosis not present

## 2017-02-03 ENCOUNTER — Encounter: Payer: Self-pay | Admitting: Pulmonary Disease

## 2017-02-03 ENCOUNTER — Telehealth: Payer: Self-pay | Admitting: Pulmonary Disease

## 2017-02-03 DIAGNOSIS — G4733 Obstructive sleep apnea (adult) (pediatric): Secondary | ICD-10-CM

## 2017-02-03 HISTORY — DX: Obstructive sleep apnea (adult) (pediatric): G47.33

## 2017-02-03 NOTE — Telephone Encounter (Signed)
Home sleep study 02/01/17 >> AHI 14.6, SaO2 low 78%   Will have my nurse inform pt that sleep study shows moderate sleep apnea.  Options are 1) CPAP now, 2) ROV first.  If She is agreeable to CPAP, then please send order for auto CPAP range 5 to 15 cm H2O with heated humidity and mask of choice.  Have download sent 1 month after starting CPAP and set up ROV 2 months after starting CPAP.  ROV can be with me or NP.

## 2017-02-06 DIAGNOSIS — G4733 Obstructive sleep apnea (adult) (pediatric): Secondary | ICD-10-CM | POA: Diagnosis not present

## 2017-02-08 ENCOUNTER — Other Ambulatory Visit: Payer: Self-pay | Admitting: *Deleted

## 2017-02-08 DIAGNOSIS — R0683 Snoring: Secondary | ICD-10-CM

## 2017-02-09 ENCOUNTER — Ambulatory Visit: Payer: Managed Care, Other (non HMO) | Admitting: Medical

## 2017-02-14 NOTE — Telephone Encounter (Signed)
Left message for pt to call back  °

## 2017-02-15 ENCOUNTER — Ambulatory Visit: Payer: Managed Care, Other (non HMO) | Admitting: Medical

## 2017-02-15 ENCOUNTER — Ambulatory Visit (INDEPENDENT_AMBULATORY_CARE_PROVIDER_SITE_OTHER): Payer: 59 | Admitting: Medical

## 2017-02-15 ENCOUNTER — Encounter: Payer: Self-pay | Admitting: Medical

## 2017-02-15 ENCOUNTER — Ambulatory Visit (HOSPITAL_BASED_OUTPATIENT_CLINIC_OR_DEPARTMENT_OTHER)
Admission: RE | Admit: 2017-02-15 | Discharge: 2017-02-15 | Disposition: A | Discharge status: Home / Self Care | Payer: 59 | Source: Ambulatory Visit | Attending: Medical | Admitting: Medical

## 2017-02-15 VITALS — BP 123/72 | HR 71 | Temp 98.4°F | Resp 16 | Ht 65.3 in | Wt 241.8 lb

## 2017-02-15 DIAGNOSIS — M25512 Pain in left shoulder: Secondary | ICD-10-CM | POA: Diagnosis not present

## 2017-02-15 DIAGNOSIS — J301 Allergic rhinitis due to pollen: Secondary | ICD-10-CM

## 2017-02-15 DIAGNOSIS — F411 Generalized anxiety disorder: Secondary | ICD-10-CM | POA: Diagnosis not present

## 2017-02-15 MED ORDER — KETOROLAC TROMETHAMINE 60 MG/2ML IM SOLN
60.0000 mg | Freq: Once | INTRAMUSCULAR | Status: AC
  Administered 2017-02-15: 60 mg via INTRAMUSCULAR

## 2017-02-15 MED ORDER — CLONAZEPAM 0.5 MG PO TABS: 0.5000 mg | ORAL_TABLET | Freq: Two times a day (BID) | ORAL | 0 refills | Status: DC | PRN

## 2017-02-15 NOTE — Patient Instructions (Addendum)
For anxiety will just use low dose klonopin. Use sparingly and just for severe anxiety or panic attacks.  For allergies/seasonal just use zyrtec presently.  For shoulder pain will get xray. Toradol 60 mg im today. Can start ibuprofen tomorrow late afternoon. If pain persists consider PT or sports medicine referral.  Xray shoulder today.  Follow up in 7 days or as needed

## 2017-02-15 NOTE — Progress Notes (Signed)
Subjective:    Patient ID: Valerie Burke, female    DOB: 05-May-1976, 41 y.o.   MRN: [id number]  HPI  Pt in for follow up.  Pt states she is forgetting to take her medication daily. She states that she will do well if she just uses the klonopin for extreme anxiety or panic attacks. Pt thinks she does not have to use sertraline daily. She uses klonopin sparingly. She had 14 tablets and uses very sparingly. Only has one tablet.  Pt states her allergies are controlled presently. She is only zyrtec presently. She is not using flonase or singulair.  Pt states over years has pain in her left shoulder. Pt can't lift arm above her head past two weeks. Pain on and off.  Recently lifted a flower pt and caused a lot of pain.  LMP- mirena.   Review of Systems  Constitutional: Positive for fatigue. Negative for chills and fever.  HENT: Negative for congestion, ear discharge, ear pain, facial swelling, sinus pain, sinus pressure and sore throat.   Respiratory: Negative for apnea, choking, shortness of breath and wheezing.   Cardiovascular: Negative for chest pain and palpitations.  Gastrointestinal: Negative for abdominal pain.  Musculoskeletal: Negative for back pain.       Lt shoulder pain. No associated cardiac symptoms.   Pain is with acitivity.  Neurological: Negative for dizziness, seizures, syncope and headaches.  Hematological: Negative for adenopathy. Does not bruise/bleed easily.  Psychiatric/Behavioral: Negative for behavioral problems, confusion and sleep disturbance. The patient is nervous/anxious. The patient is not hyperactive.        See hpi rare severe anxiety and panic attacks.    Past Medical History:  Diagnosis Date  . Allergy   . Anemia   . Anxiety   . GERD (gastroesophageal reflux disease)   . OSA (obstructive sleep apnea) 02/03/2017     Social History   Social History  . Marital status: Married    Spouse name: N/A  . Number of children: 3  . Years of  education: N/A   Occupational History  . part time    Social History Main Topics  . Smoking status: Never Smoker  . Smokeless tobacco: Never Used  . Alcohol use Yes     Comment: 2-3 glasses of wine  . Drug use: No  . Sexual activity: No   Other Topics Concern  . Not on file   Social History Narrative  . No narrative on file    Past Surgical History:  Procedure Laterality Date  . No prior surgery    . WISDOM TOOTH EXTRACTION     age 32    Family History  Problem Relation Age of Onset  . Hypertension Father   . Hypertension Paternal Uncle   . Heart disease Maternal Grandmother     Allergies  Allergen Reactions  . Levofloxacin Rash    Current Outpatient Prescriptions on File Prior to Visit  Medication Sig Dispense Refill  . cetirizine (ZYRTEC) 10 MG tablet Take 10 mg by mouth daily.    . clonazePAM (KLONOPIN) 0.5 MG tablet Take 1 tablet (0.5 mg total) by mouth 2 (two) times daily as needed for anxiety. 14 tablet 0  . fluticasone (FLONASE) 50 MCG/ACT nasal spray Place 2 sprays into both nostrils daily. 16 g 1  . montelukast (SINGULAIR) 10 MG tablet Take 1 tablet (10 mg total) by mouth at bedtime. 30 tablet 5  . sertraline (ZOLOFT) 25 MG tablet Take 1 tablet (25 mg total)  by mouth daily. (Patient not taking: Reported on 01/05/2017) 30 tablet 5   No current facility-administered medications on file prior to visit.     BP 123/72 (BP Location: Right Arm, Cuff Size: Large)   Pulse 71   Temp 98.4 F (36.9 C) (Oral)   Resp 16   Ht 5' 5.3" (1.659 m)   Wt 241 lb 12.8 oz (109.7 kg)   SpO2 100%   BMI 39.87 kg/m       Objective:   Physical Exam   General  Mental Status - Alert. General Appearance - Well groomed. Not in acute distress.  Skin Rashes- No Rashes.  HEENT Head- Normal. Ear Auditory Canal - Left- Normal. Right - Normal.Tympanic Membrane- Left- Normal. Right- Normal. Eye Sclera/Conjunctiva- Left- Normal. Right- Normal. Nose & Sinuses Nasal  Mucosa- Left-  Boggy and Congested. Right-  Boggy and  Congested.Bilateral  No maxillary and no  frontal sinus pressure. Mouth & Throat Lips: Upper Lip- Normal: no dryness, cracking, pallor, cyanosis, or vesicular eruption. Lower Lip-Normal: no dryness, cracking, pallor, cyanosis or vesicular eruption. Buccal Mucosa- Bilateral- No Aphthous ulcers. Oropharynx- No Discharge or Erythema. Tonsils: Characteristics- Bilateral- No Erythema or Congestion. Size/Enlargement- Bilateral- No enlargement. Discharge- bilateral-None.  Neck Neck- Supple. No Masses.   Chest and Lung Exam Auscultation: Breath Sounds:-Clear even and unlabored.  Cardiovascular Auscultation:Rythm- Regular, rate and rhythm. Murmurs & Other Heart Sounds:Ausculatation of the heart reveal- No Murmurs.  Lymphatic Head & Neck General Head & Neck Lymphatics: Bilateral: Description- No Localized lymphadenopathy.  Lt shoulder- pain when abducts arm to shoulder level.  Pain anterior aspect bicep tendon area.     Assessment & Plan:  For anxiety will just use low dose klonopin. Use sparingly and just for severe anxiety or panic attacks.  For allergies/seasonal just use zyrtec presently.  For shoulder pain will get xray. Toradol 60 mg im today. Can start ibuprofen tomorrow late afternoon. If pain persists consider PT or sports medicine referral.  Follow up in 7 days or as needed  Xray shoulder today.  Baldwin Burke, Valerie Dredge, PA-C

## 2017-02-15 NOTE — Telephone Encounter (Signed)
Patient returned phone call, contact [phone].Charm Rings

## 2017-02-15 NOTE — Progress Notes (Signed)
Pre visit review using our clinic review tool, if applicable. No additional management support is needed unless otherwise documented below in the visit note. 

## 2017-02-15 NOTE — Telephone Encounter (Signed)
Spoke with pt and notified of results per Dr. Craige Cotta. Pt verbalized understanding and denied any questions. She would like to go ahead and set up CPAP  Order sent to [facility] and f/u scheduled for 05-19-17  She will call if having any problems with CPAP

## 2017-02-20 ENCOUNTER — Ambulatory Visit (INDEPENDENT_AMBULATORY_CARE_PROVIDER_SITE_OTHER): Payer: 59 | Admitting: Medical

## 2017-02-20 VITALS — BP 133/74 | HR 103 | Temp 98.2°F | Ht 65.3 in | Wt 236.6 lb

## 2017-02-20 DIAGNOSIS — J029 Acute pharyngitis, unspecified: Secondary | ICD-10-CM

## 2017-02-20 DIAGNOSIS — J40 Bronchitis, not specified as acute or chronic: Secondary | ICD-10-CM

## 2017-02-20 DIAGNOSIS — R062 Wheezing: Secondary | ICD-10-CM | POA: Diagnosis not present

## 2017-02-20 DIAGNOSIS — J0100 Acute maxillary sinusitis, unspecified: Secondary | ICD-10-CM | POA: Diagnosis not present

## 2017-02-20 LAB — POCT RAPID STREP A (OFFICE): Rapid Strep A Screen: NEGATIVE

## 2017-02-20 MED ORDER — HYDROCODONE-HOMATROPINE 5-1.5 MG/5ML PO SYRP: 5.0000 mL | ORAL_SOLUTION | Freq: Three times a day (TID) | ORAL | 0 refills | Status: DC | PRN

## 2017-02-20 MED ORDER — ALBUTEROL SULFATE HFA 108 (90 BASE) MCG/ACT IN AERS
2.0000 | INHALATION_SPRAY | Freq: Four times a day (QID) | RESPIRATORY_TRACT | 0 refills | Status: DC | PRN
Start: 2017-02-20 — End: 2017-03-25

## 2017-02-20 MED ORDER — DOXYCYCLINE HYCLATE 100 MG PO TABS: 100.0000 mg | ORAL_TABLET | Freq: Two times a day (BID) | ORAL | 0 refills | Status: DC

## 2017-02-20 MED ORDER — AZELASTINE HCL 0.1 % NA SOLN: 2.0000 | Freq: Two times a day (BID) | NASAL | 3 refills | Status: DC

## 2017-02-20 MED ORDER — BENZONATATE 100 MG PO CAPS: 100.0000 mg | ORAL_CAPSULE | Freq: Three times a day (TID) | ORAL | 0 refills | Status: DC | PRN

## 2017-02-20 NOTE — Progress Notes (Signed)
Subjective:    Patient ID: Valerie Burke, female    DOB: [birth year], 41 y.o.   MRN: [id number]  HPI  Pt in states on Saturday started to feel sick. She states Sunday morning nasal congestion, sinus pressure, st and chest congestion. Pt coughing up mucous. Pt does struggle with allergies this time of the  Year.  Some wheezing last 2 days mildly.   Review of Systems  Constitutional: Negative for chills, fatigue and fever.  HENT: Positive for congestion, postnasal drip, rhinorrhea, sinus pain and sinus pressure.   Respiratory: Positive for cough. Negative for chest tightness, shortness of breath and wheezing.   Cardiovascular: Negative for chest pain and palpitations.  Gastrointestinal: Negative for abdominal pain.  Genitourinary: Negative for dysuria.  Musculoskeletal: Negative for back pain and myalgias.  Skin: Negative for rash.  Neurological: Negative for dizziness, weakness, numbness and headaches.  Hematological: Negative for adenopathy. Does not bruise/bleed easily.  Psychiatric/Behavioral: Negative for agitation and confusion.    Past Medical History:  Diagnosis Date  . Allergy   . Anemia   . Anxiety   . GERD (gastroesophageal reflux disease)   . OSA (obstructive sleep apnea) 02/03/2017     Social History   Social History  . Marital status: Married    Spouse name: N/A  . Number of children: 3  . Years of education: N/A   Occupational History  . part time    Social History Main Topics  . Smoking status: Never Smoker  . Smokeless tobacco: Never Used  . Alcohol use Yes     Comment: 2-3 glasses of wine  . Drug use: No  . Sexual activity: No   Other Topics Concern  . Not on file   Social History Narrative  . No narrative on file    Past Surgical History:  Procedure Laterality Date  . No prior surgery    . WISDOM TOOTH EXTRACTION     age 51    Family History  Problem Relation Age of Onset  . Hypertension Father   . Hypertension Paternal Uncle     . Heart disease Maternal Grandmother     Allergies  Allergen Reactions  . Levofloxacin Rash    Current Outpatient Prescriptions on File Prior to Visit  Medication Sig Dispense Refill  . cetirizine (ZYRTEC) 10 MG tablet Take 10 mg by mouth daily.    . clonazePAM (KLONOPIN) 0.5 MG tablet Take 1 tablet (0.5 mg total) by mouth 2 (two) times daily as needed for anxiety. 14 tablet 0  . fluticasone (FLONASE) 50 MCG/ACT nasal spray Place 2 sprays into both nostrils daily. 16 g 1  . montelukast (SINGULAIR) 10 MG tablet Take 1 tablet (10 mg total) by mouth at bedtime. 30 tablet 5  . sertraline (ZOLOFT) 25 MG tablet Take 1 tablet (25 mg total) by mouth daily. 30 tablet 5   No current facility-administered medications on file prior to visit.     BP 133/74   Pulse (!) 103   Temp 98.2 F (36.8 C) (Oral)   Ht 5' 5.3" (1.659 m)   Wt 236 lb 9.6 oz (107.3 kg)   SpO2 99%   BMI 39.01 kg/m       Objective:   Physical Exam   General  Mental Status - Alert. General Appearance - Well groomed. Not in acute distress.  Skin Rashes- No Rashes.  HEENT Head- Normal. Ear Auditory Canal - Left- Normal. Right - Normal.Tympanic Membrane- Left- Normal. Right- Normal. Eye Sclera/Conjunctiva- Left-  Normal. Right- Normal. Nose & Sinuses Nasal Mucosa- Left-  Boggy and Congested. Right-  Boggy and  Congested.Bilateral maxillary and frontal sinus pressure. Mouth & Throat Lips: Upper Lip- Normal: no dryness, cracking, pallor, cyanosis, or vesicular eruption. Lower Lip-Normal: no dryness, cracking, pallor, cyanosis or vesicular eruption. Buccal Mucosa- Bilateral- No Aphthous ulcers. Oropharynx- No Discharge or Erythema. +pnd. Tonsils: Characteristics- Bilateral- No Erythema or Congestion. Size/Enlargement- Bilateral- No enlargement. Discharge- bilateral-None.  Neck Neck- Supple. No Masses.   Chest and Lung Exam Auscultation: Breath Sounds:-Clear even and  unlabored.  Cardiovascular Auscultation:Rythm- Regular, rate and rhythm. Murmurs & Other Heart Sounds:Ausculatation of the heart reveal- No Murmurs.  Lymphatic Head & Neck General Head & Neck Lymphatics: Bilateral: Description- No Localized lymphadenopathy.      Assessment & Plan:   You appear to have bronchitis and sinusitis. Rest hydrate and tylenol for fever. I am prescribing cough medicine hycodan, and doxycycline antibiotic. For your nasal congestion you could use your flonase.  For wheezing rx albuterol  You should gradually get better. If not then notify us and would recommend a chest xray.  Follow up in 7-10 days or as needed  I am adding astelin to your allergy regimen this for this spring.

## 2017-02-20 NOTE — Progress Notes (Signed)
Pre visit review using our clinic tool,if applicable. No additional management support is needed unless otherwise documented below in the visit note.  

## 2017-02-20 NOTE — Patient Instructions (Addendum)
You appear to have bronchitis and sinusitis. Rest hydrate and tylenol for fever. I am prescribing cough medicine hycodan, and doxycycline antibiotic. For your nasal congestion you could use your flonase.  For wheezing rx albuterol  You should gradually get better. If not then notify us and would recommend a chest xray.  Follow up in 7-10 days or as needed  I am adding astelin to your allergy regimen for this spring.

## 2017-02-22 ENCOUNTER — Telehealth: Payer: Self-pay | Admitting: Medical

## 2017-02-22 DIAGNOSIS — R059 Cough, unspecified: Secondary | ICD-10-CM

## 2017-02-22 DIAGNOSIS — R05 Cough: Secondary | ICD-10-CM

## 2017-02-22 NOTE — Telephone Encounter (Signed)
°  Relation to ZO:XWRU Call back number:[phone]    Reason for call:   Patient last seen 02/20/17 by Ramon Dredge as per AVS: You should gradually get better. If not then notify us and would recommend a chest xray.Patient requesting chest x ray orders and would like to speak with nurse when orders are placed.

## 2017-02-23 NOTE — Telephone Encounter (Signed)
Can get cxr tomorrow. I am out of office tomorrow. So she might ask to be seen tomorrow by other provider in our office or follow up with me on Monday. Urgenct care or ED available also if needed

## 2017-02-23 NOTE — Telephone Encounter (Signed)
Notified pt she X-Ray can be done tomorrow walk in. Pt voiced understanding. Pt stated she would call to make appointment depending on what the X-Rays shows.

## 2017-02-24 ENCOUNTER — Ambulatory Visit (INDEPENDENT_AMBULATORY_CARE_PROVIDER_SITE_OTHER): Payer: 59 | Admitting: Internal Medicine

## 2017-02-24 ENCOUNTER — Encounter: Payer: Self-pay | Admitting: Internal Medicine

## 2017-02-24 ENCOUNTER — Ambulatory Visit (HOSPITAL_BASED_OUTPATIENT_CLINIC_OR_DEPARTMENT_OTHER)
Admission: RE | Admit: 2017-02-24 | Discharge: 2017-02-24 | Disposition: A | Discharge status: Home / Self Care | Payer: 59 | Source: Ambulatory Visit | Attending: Medical | Admitting: Medical

## 2017-02-24 VITALS — BP 130/84 | HR 92 | Temp 98.0°F | Ht 65.3 in | Wt 240.2 lb

## 2017-02-24 DIAGNOSIS — R05 Cough: Secondary | ICD-10-CM | POA: Diagnosis present

## 2017-02-24 DIAGNOSIS — J301 Allergic rhinitis due to pollen: Secondary | ICD-10-CM

## 2017-02-24 DIAGNOSIS — J00 Acute nasopharyngitis [common cold]: Secondary | ICD-10-CM | POA: Diagnosis not present

## 2017-02-24 DIAGNOSIS — R059 Cough, unspecified: Secondary | ICD-10-CM

## 2017-02-24 MED ORDER — AMOXICILLIN-POT CLAVULANATE 875-125 MG PO TABS: 1.0000 | ORAL_TABLET | Freq: Two times a day (BID) | ORAL | 0 refills | Status: DC

## 2017-02-24 NOTE — Patient Instructions (Addendum)
Acute sinusitis symptoms for less than 10 days are GENERALLY NOT HELPED BY ANTIBIOTIC therapy.  Use saline irrigation, warm  moist compresses and over-the-counter decongestants only as directed.  Call if there is no improvement in 5 to 7 days, or sooner if you develop increasing pain, fever, or any new symptoms.  I WOULD NOT START TAKING AUGMENTIN UNLESS YOU DEVELOP FEVER , WORSENING SINUS PAIN OR PURULENT SINUS DRAINAGE  Hydrate and Humidify   Drink enough water to keep your urine clear or pale yellow. Staying hydrated will help to thin your mucus.  Use a cool mist humidifier to keep the humidity level in your home above 50%.  Inhale steam for 10-15 minutes, 3-4 times a day or as told by your health care provider. You can do this in the bathroom while a hot shower is running.  Limit your exposure to cool or dry air. Rest   Rest as much as possible.

## 2017-02-24 NOTE — Telephone Encounter (Signed)
Pt called back and stated that she has an appt at another [facility] site today at 3 pm.  No additional needs voiced at this time.

## 2017-02-24 NOTE — Telephone Encounter (Signed)
CXR completed today.  Per provider: Notes recorded by Esperanza Richters, PA-C on 02/24/2017 at 11:52 AM EDT Chest xray was clear. Notify pt. Would you offer her appointment Monday or Tuesday.  Pt is asking for another antibiotic.  Called to follow up with patient.  Left a message for callback.

## 2017-02-24 NOTE — Telephone Encounter (Signed)
Caller name: Percy Relationship to patient: self Can be reached: [phone] Pharmacy: [id number] - [place],  - [address] [facility] [address]  Reason for call: pt called stating she is still feeling terrible. Per xray no abnormality. Pt is asking if she could have change of abx called in today since we cannot get her in for appt today. Please call pt to notify.

## 2017-02-24 NOTE — Progress Notes (Signed)
Pre visit review using our clinic review tool, if applicable. No additional management support is needed unless otherwise documented below in the visit note. 

## 2017-02-24 NOTE — Progress Notes (Signed)
Subjective:    Patient ID: Valerie Burke, female    DOB: 13-Feb-1976, 41 y.o.   MRN: [id number]  HPI  41 year old patient who has a history of allergic rhinitis.  She has been ill for approximately 6 days with increasing sinus congestion, drainage and cough.  She had some  Intermittent wheezing earlier in the week.  She is on a number of maintenance allergy medications.  She was seen by her PCP.  4 days ago and treated for sinusitis with  Doxycycline. The patient still feels unwell and wonders if a change in antibiotic therapy would be appropriate.  Past Medical History:  Diagnosis Date  . Allergy   . Anemia   . Anxiety   . GERD (gastroesophageal reflux disease)   . OSA (obstructive sleep apnea) 02/03/2017     Social History   Social History  . Marital status: Married    Spouse name: N/A  . Number of children: 3  . Years of education: N/A   Occupational History  . part time    Social History Main Topics  . Smoking status: Never Smoker  . Smokeless tobacco: Never Used  . Alcohol use Yes     Comment: 2-3 glasses of wine  . Drug use: No  . Sexual activity: No   Other Topics Concern  . Not on file   Social History Narrative  . No narrative on file    Past Surgical History:  Procedure Laterality Date  . No prior surgery    . WISDOM TOOTH EXTRACTION     age 64    Family History  Problem Relation Age of Onset  . Hypertension Father   . Hypertension Paternal Uncle   . Heart disease Maternal Grandmother     Allergies  Allergen Reactions  . Levofloxacin Rash    Current Outpatient Prescriptions on File Prior to Visit  Medication Sig Dispense Refill  . doxycycline (VIBRA-TABS) 100 MG tablet Take 1 tablet (100 mg total) by mouth 2 (two) times daily. 20 tablet 0  . HYDROcodone-homatropine (HYCODAN) 5-1.5 MG/5ML syrup Take 5 mLs by mouth every 8 (eight) hours as needed for cough. 120 mL 0  . albuterol (PROVENTIL HFA;VENTOLIN HFA) 108 (90 Base) MCG/ACT inhaler  Inhale 2 puffs into the lungs every 6 (six) hours as needed for wheezing or shortness of breath. (Patient not taking: Reported on 02/24/2017) 1 Inhaler 0  . azelastine (ASTELIN) 0.1 % nasal spray Place 2 sprays into both nostrils 2 (two) times daily. Use in each nostril as directed (Patient not taking: Reported on 02/24/2017) 30 mL 3  . benzonatate (TESSALON) 100 MG capsule Take 1 capsule (100 mg total) by mouth 3 (three) times daily as needed for cough. (Patient not taking: Reported on 02/24/2017) 21 capsule 0  . cetirizine (ZYRTEC) 10 MG tablet Take 10 mg by mouth daily.    . clonazePAM (KLONOPIN) 0.5 MG tablet Take 1 tablet (0.5 mg total) by mouth 2 (two) times daily as needed for anxiety. (Patient not taking: Reported on 02/24/2017) 14 tablet 0  . fluticasone (FLONASE) 50 MCG/ACT nasal spray Place 2 sprays into both nostrils daily. (Patient not taking: Reported on 02/24/2017) 16 g 1  . montelukast (SINGULAIR) 10 MG tablet Take 1 tablet (10 mg total) by mouth at bedtime. (Patient not taking: Reported on 02/24/2017) 30 tablet 5  . sertraline (ZOLOFT) 25 MG tablet Take 1 tablet (25 mg total) by mouth daily. (Patient not taking: Reported on 02/24/2017) 30 tablet 5  No current facility-administered medications on file prior to visit.     BP 130/84 (BP Location: Left Arm, Patient Position: Sitting, Cuff Size: Normal)   Pulse 92   Temp 98 F (36.7 C) (Oral)   Ht 5' 5.3" (1.659 m)   Wt 240 lb 3.2 oz (109 kg)   SpO2 99%   BMI 39.60 kg/m     Review of Systems  Constitutional: Positive for activity change, appetite change and fatigue.  HENT: Positive for congestion, postnasal drip, rhinorrhea and sinus pressure. Negative for dental problem, hearing loss, sore throat and tinnitus.   Eyes: Negative for pain, discharge and visual disturbance.  Respiratory: Positive for cough and shortness of breath.   Cardiovascular: Negative for chest pain, palpitations and leg swelling.  Gastrointestinal: Negative  for abdominal distention, abdominal pain, blood in stool, constipation, diarrhea, nausea and vomiting.  Genitourinary: Negative for difficulty urinating, dysuria, flank pain, frequency, hematuria, pelvic pain, urgency, vaginal bleeding, vaginal discharge and vaginal pain.  Musculoskeletal: Negative for arthralgias, gait problem and joint swelling.  Skin: Negative for rash.  Neurological: Negative for dizziness, syncope, speech difficulty, weakness, numbness and headaches.  Hematological: Negative for adenopathy.  Psychiatric/Behavioral: Negative for agitation, behavioral problems and dysphoric mood. The patient is not nervous/anxious.        Objective:   Physical Exam  Constitutional: She is oriented to person, place, and time. She appears well-developed and well-nourished. No distress.  Appears unwell, but in no distress Afebrile O2 saturation 99%  HENT:  Head: Normocephalic.  Right Ear: External ear normal.  Left Ear: External ear normal.  Mouth/Throat: Oropharynx is clear and moist.  Slight rhinorrhea and congested.  Nasal mucosa  Eyes: Conjunctivae and EOM are normal. Pupils are equal, round, and reactive to light.  Neck: Normal range of motion. Neck supple. No thyromegaly present.  Cardiovascular: Normal rate, regular rhythm, normal heart sounds and intact distal pulses.   Pulmonary/Chest: Effort normal and breath sounds normal. No respiratory distress. She has no wheezes. She has no rales.  Abdominal: Soft. Bowel sounds are normal. She exhibits no mass. There is no tenderness.  Musculoskeletal: Normal range of motion.  Lymphadenopathy:    She has no cervical adenopathy.  Neurological: She is alert and oriented to person, place, and time.  Skin: Skin is warm and dry. No rash noted.  Psychiatric: She has a normal mood and affect. Her behavior is normal.          Assessment & Plan:   Viral URI  Allergic rhinitis  Will hold further antibiotic therapy at this time.  Signs  of secondary acute bacterial  Sinusitis.  Discussed We'll treat symptoms aggressively.  We'll attempt to hydrate  Rogelia Boga

## 2017-02-24 NOTE — Telephone Encounter (Signed)
Transferred pt to scheduler to get scheduled at another [facility] site.

## 2017-03-25 ENCOUNTER — Other Ambulatory Visit: Payer: Self-pay | Admitting: Medical

## 2017-05-01 ENCOUNTER — Other Ambulatory Visit: Payer: Self-pay | Admitting: Medical

## 2017-05-01 ENCOUNTER — Other Ambulatory Visit: Payer: Self-pay

## 2017-05-01 MED ORDER — MONTELUKAST SODIUM 10 MG PO TABS: 10.0000 mg | ORAL_TABLET | Freq: Every day | ORAL | 5 refills | Status: DC

## 2017-05-01 NOTE — Telephone Encounter (Signed)
Singulair refilled per patient request.

## 2017-05-01 NOTE — Telephone Encounter (Signed)
Caller name: Duwayne HeckDanielle Relationship to patient: self Can be reached: [phone] [facility]acy[id number] - [place], [place] - [address]  Reason for call: Pt called for refill on generic singulair 90 day supply per ins. [facility]. Pt is out of meds and just received notification from [facility] they cannot fill per her insurance restrictions.

## 2017-05-18 ENCOUNTER — Telehealth: Payer: Self-pay | Admitting: Pulmonary Disease

## 2017-05-18 NOTE — Telephone Encounter (Signed)
Spoke with patient and asked that she bring her CPAP machine to appointment on 05/19/17. Pt states that she never received machine as it was too expensive and to cancel her appointment. Pt was advise to keep appointment to discuss that information with Dr. Craige CottaSood. Nothing further is needed.

## 2017-05-19 ENCOUNTER — Ambulatory Visit (INDEPENDENT_AMBULATORY_CARE_PROVIDER_SITE_OTHER): Payer: 59 | Admitting: Pulmonary Disease

## 2017-05-19 ENCOUNTER — Encounter: Payer: Self-pay | Admitting: Pulmonary Disease

## 2017-05-19 VITALS — BP 124/82 | HR 93 | Ht 63.0 in | Wt 240.6 lb

## 2017-05-19 DIAGNOSIS — J45909 Unspecified asthma, uncomplicated: Secondary | ICD-10-CM | POA: Diagnosis not present

## 2017-05-19 DIAGNOSIS — J309 Allergic rhinitis, unspecified: Secondary | ICD-10-CM | POA: Diagnosis not present

## 2017-05-19 DIAGNOSIS — J351 Hypertrophy of tonsils: Secondary | ICD-10-CM | POA: Diagnosis not present

## 2017-05-19 DIAGNOSIS — Z6841 Body Mass Index (BMI) 40.0 and over, adult: Secondary | ICD-10-CM

## 2017-05-19 DIAGNOSIS — G4733 Obstructive sleep apnea (adult) (pediatric): Secondary | ICD-10-CM | POA: Diagnosis not present

## 2017-05-19 MED ORDER — OMEPRAZOLE 40 MG PO CPDR: 40.0000 mg | DELAYED_RELEASE_CAPSULE | Freq: Every day | ORAL | 1 refills | Status: DC

## 2017-05-19 NOTE — Patient Instructions (Signed)
Will arrange for referral to allergist  Omeprazole 40 mg daily >> take 30 minutes before first meal of the day  Check online about options for buying a CPAP machine on your own  Check with your dentist about whether you would be a candidate for an oral appliance to treat sleep apnea  Follow up in 6 to 8 weeks with Dr. Craige CottaSood or Nurse Practitioner

## 2017-05-19 NOTE — Progress Notes (Signed)
Current Outpatient Prescriptions on File Prior to Visit  Medication Sig  . cetirizine (ZYRTEC) 10 MG tablet Take 10 mg by mouth daily.  . montelukast (SINGULAIR) 10 MG tablet Take 1 tablet (10 mg total) by mouth at bedtime.  Marland Kitchen. PROAIR HFA 108 (90 Base) MCG/ACT inhaler INHALE 2 PUFFS INTO THE LUNGS EVERY 6 HOURS AS NEEDED FOR WHEEZING OR SHORTNESS OF BREATH  . azelastine (ASTELIN) 0.1 % nasal spray Place 2 sprays into both nostrils 2 (two) times daily. Use in each nostril as directed (Patient not taking: Reported on 05/19/2017)  . clonazePAM (KLONOPIN) 0.5 MG tablet Take 1 tablet (0.5 mg total) by mouth 2 (two) times daily as needed for anxiety. (Patient not taking: Reported on 02/24/2017)  . HYDROcodone-homatropine (HYCODAN) 5-1.5 MG/5ML syrup Take 5 mLs by mouth every 8 (eight) hours as needed for cough. (Patient not taking: Reported on 05/19/2017)   No current facility-administered medications on file prior to visit.      Chief Complaint  Patient presents with  . Follow-up    Pt was not able to get CPAP machine d/t cost. Pt wants to discuss options.     Sleep tests HST 02/01/17 >> AHI 14.6, SaO2 low 76%  Past medical history Anemia, anxiety, GERD  Past surgical history, Family history, Social history, Allergies all reviewed.  Vital Signs BP 124/82 (BP Location: Left Arm, Cuff Size: Normal)   Pulse 93   Ht 5\' 3"  (1.6 m)   Wt 240 lb 9.6 oz (109.1 kg)   SpO2 96%   BMI 42.62 kg/m   History of Present Illness Valerie Burke is a 41 y.o. female with obstructive sleep apnea.  She had sleep study in March.  Showed mild to moderate OSA.  She wasn't able to afford CPAP set up.  She doesn't feel like her tonsils are as swollen as before.  She gets coughing episode about 1/2 through meals.  She feels congested in her throat when this happens.  She has noticed more trouble with her breathing with change of seasons.  She was seen by Dr. Corinda GublerLeBauer years ago for allergies and told she was  allergic to everything.  She is using zyrtec bid.  She feels singulair has helped.    Physical Exam  General - No distress ENT - No sinus tenderness, no oral exudate, no LAN, 2+ tonsils, elongated uvula, MP 4 Cardiac - s1s2 regular, no murmur Chest - No wheeze/rales/dullness Back - No focal tenderness Abd - Soft, non-tender Ext - No edema Neuro - Normal strength Skin - No rashes Psych - normal mood, and behavior   Assessment/Plan  Obstructive sleep apnea. - she will check on line about buying a CPAP on her own - she will check with her dentist about whether she is a candidate for an oral appliance - might need to consider ENT evaluation if her tonsils remain enlarged   Allergic rhinitis with tonsillar hypertrophy. Allergic asthma. - continue zyrtec, singulair, astelin, and proair - will arrange for referral to allergist >> defer further assessment of asthma until after allergy evaluation  Possible reflux contributing to cough during eating. - will give her trial of omeprazole   Patient Instructions  Will arrange for referral to allergist  Omeprazole 40 mg daily >> take 30 minutes before first meal of the day  Check online about options for buying a CPAP machine on your own  Check with your dentist about whether you would be a candidate for an oral appliance to treat  sleep apnea  Follow up in 6 to 8 weeks with Dr. Craige Cotta or Nurse Practitioner   Time spent 28 minutes   Coralyn Helling, MD [facility] Pager:  [phone] 05/19/2017, 2:17 PM

## 2017-07-04 ENCOUNTER — Ambulatory Visit (INDEPENDENT_AMBULATORY_CARE_PROVIDER_SITE_OTHER): Payer: 59 | Admitting: Allergy and Immunology

## 2017-07-04 ENCOUNTER — Ambulatory Visit (INDEPENDENT_AMBULATORY_CARE_PROVIDER_SITE_OTHER): Payer: 59 | Admitting: Medical

## 2017-07-04 ENCOUNTER — Encounter: Payer: Self-pay | Admitting: Allergy and Immunology

## 2017-07-04 ENCOUNTER — Telehealth: Payer: Self-pay | Admitting: Medical

## 2017-07-04 VITALS — BP 122/88 | HR 76 | Temp 97.6°F | Resp 16 | Ht 63.5 in | Wt 241.8 lb

## 2017-07-04 VITALS — BP 130/77 | HR 92 | Temp 98.4°F | Ht 60.0 in | Wt 241.4 lb

## 2017-07-04 DIAGNOSIS — J4530 Mild persistent asthma, uncomplicated: Secondary | ICD-10-CM

## 2017-07-04 DIAGNOSIS — R300 Dysuria: Secondary | ICD-10-CM

## 2017-07-04 DIAGNOSIS — K219 Gastro-esophageal reflux disease without esophagitis: Secondary | ICD-10-CM | POA: Diagnosis not present

## 2017-07-04 DIAGNOSIS — H1010 Acute atopic conjunctivitis, unspecified eye: Secondary | ICD-10-CM

## 2017-07-04 DIAGNOSIS — F419 Anxiety disorder, unspecified: Secondary | ICD-10-CM

## 2017-07-04 DIAGNOSIS — H1045 Other chronic allergic conjunctivitis: Secondary | ICD-10-CM | POA: Diagnosis not present

## 2017-07-04 DIAGNOSIS — L719 Rosacea, unspecified: Secondary | ICD-10-CM | POA: Diagnosis not present

## 2017-07-04 DIAGNOSIS — J3089 Other allergic rhinitis: Secondary | ICD-10-CM

## 2017-07-04 LAB — POCT URINALYSIS DIPSTICK
Bilirubin, UA: NEGATIVE
GLUCOSE UA: NEGATIVE
KETONES UA: NEGATIVE
LEUKOCYTES UA: NEGATIVE
Nitrite, UA: NEGATIVE
Protein, UA: POSITIVE
UROBILINOGEN UA: 0.2 U/dL
pH, UA: 6 (ref 5.0–8.0)

## 2017-07-04 MED ORDER — OMEPRAZOLE 40 MG PO CPDR: DELAYED_RELEASE_CAPSULE | ORAL | 3 refills | Status: DC

## 2017-07-04 MED ORDER — MOMETASONE FUROATE 200 MCG/ACT IN AERO: 2.0000 | INHALATION_SPRAY | Freq: Every day | RESPIRATORY_TRACT | 5 refills | Status: DC

## 2017-07-04 MED ORDER — CLONAZEPAM 0.5 MG PO TABS: 0.5000 mg | ORAL_TABLET | Freq: Two times a day (BID) | ORAL | 0 refills | Status: DC | PRN

## 2017-07-04 MED ORDER — OLOPATADINE HCL 0.1 % OP SOLN: OPHTHALMIC | 5 refills | Status: DC

## 2017-07-04 MED ORDER — METRONIDAZOLE 0.75 % EX CREA: TOPICAL_CREAM | CUTANEOUS | 5 refills | Status: DC

## 2017-07-04 MED ORDER — NITROFURANTOIN MONOHYD MACRO 100 MG PO CAPS: 100.0000 mg | ORAL_CAPSULE | Freq: Two times a day (BID) | ORAL | 0 refills | Status: DC

## 2017-07-04 NOTE — Patient Instructions (Addendum)
  1. Allergen avoidance measures  2. Treat and prevent inflammation:   A. OTC Nasacort / Rhinocort - One spray each nostril one time per day  B. Asmanex 200 HFA - 2 inhalations one time per day  C. Montelukast 10mg  one tablet one time per day  3. Treat and prevent rosacea:   A. MetroCream applied twice a day to skin  4. If needed:   A. Zyrtec 10 mg twice a day  B. Patanol one drop each eye twice a day  C. Proair HFA 2 puffs very 4-6 hours  5. "Action plan" for "flareup":   A. increase Asmanex to 3 inhalations 3 times a day  B. start omeprazole 40 mg twice a day  C. start OTC Mucinex DM 2 tablets twice a day  D. continue Zyrtec, Patanol, Proventil HFA if needed  6. Attempt to avoid systemic steroids and antibiotics  7. Attempt to minimize caffeine and chocolate consumption  8. Immunotherapy?  9. Further treatment for rosacea?  10. Return to clinic in 4 weeks or earlier if problem

## 2017-07-04 NOTE — Patient Instructions (Addendum)
Your appear to have a urinary tract infection. I am prescribing macrobid antibiotic for the probable infection. Hydrate well. I am sending out a urine culture. During the interim if your signs and symptoms worsen rather than improving please notify us. We will notify your when the culture results are back.  For anxiety will refill your clonazepam. 14 tabs used since April. Will refill today. Continue very occasional/rare use prn anxiety.  Follow up in 7 days or as needed.

## 2017-07-04 NOTE — Telephone Encounter (Signed)
Opened to review 

## 2017-07-04 NOTE — Progress Notes (Signed)
Dear Dr. Craige Cotta,  Thank you for referring Valerie Burke to the [facility] [place] on 07/04/2017.   Below is a summation of this patient's evaluation and recommendations.  Thank you for your referral. I will keep you informed about this patient's response to treatment.   If you have any questions please do not hesitate to contact me.   Sincerely,  Valerie Priest, MD Allergy / Immunology [facility]   ______________________________________________________________________    NEW PATIENT NOTE  Referring Provider: Coralyn Helling, MD Primary Provider: Marisue Burke Date of office visit: 07/04/2017    Subjective:   Chief Complaint:  Valerie Burke (DOB: 1975-12-30) is a 41 y.o. female who presents to the clinic on 07/04/2017 with a chief complaint of Allergic Rhinitis  .     HPI: Valerie Burke presents to this clinic in evaluation of 2 main issues.  First, she has a long history of allergic rhinoconjunctivitis manifested as nasal congestion and sneezing and itchy eyes occurring on a perennial basis especially following exposure to dust. She uses Zyrtec twice a day which she feels does help and she uses montelukast once a day which she feels does help. She has tried nasal steroids in the past and has developed localized mucosal irritation with the use of this agent. Particularly, this appeared to occur with Flonase.  Second, she develops these episodes of "sinusitis with bronchitis" as a package multiple times a year. Usually this will start with nasal congestion and sneezing and then quickly evolves to unrelenting coughing spells associated with micturition and gagging and retching. She usually is treated with systemic steroids and Augmentin. For 2018 she has had 3 of these episodes and has received 3 systemic steroids and an antibiotic. Interestingly, after she develops one of these episodes she then  has 2 months of constant throat clearing and mucus production and coughing spells after eating. She feels as though her uvula is so swollen it hits her tongue. She believes that her tonsils swell up as well during this timeframe. She states that she has had four pneumonias throughout her lifetime with her first pneumonia occurring in fourth grade and her last pneumonia occurring last year.  She does not really have a history of exercise-induced bronchospastic symptoms or cold air induced bronchospastic symptoms. She has been given a pro-air inhaler but she is not really sure that this has helped her very much. She does not have any classic reflux symptoms if she is not in one of her episodes of "sinusitis and bronchitis" and for the 2 months after that episode. It should be noted that she does drink 4 Cokes per day and has chocolate twice a week.  She also appears to have documented sleep apnea which presently is untreated as she cannot tolerate the CPAP mask.  Past Medical History:  Diagnosis Date  . Allergy   . Anemia   . Anxiety   . GERD (gastroesophageal reflux disease)   . OSA (obstructive sleep apnea) 02/03/2017    Past Surgical History:  Procedure Laterality Date  . WISDOM TOOTH EXTRACTION     age 71    Allergies as of 07/04/2017      Reactions   Levofloxacin Rash      Medication List      azelastine 0.1 % nasal spray Commonly known as:  ASTELIN Place 2 sprays into both nostrils 2 (two) times daily. Use in each  nostril as directed   cetirizine 10 MG tablet Commonly known as:  ZYRTEC Take 10 mg by mouth daily.   clonazePAM 0.5 MG tablet Commonly known as:  KLONOPIN Take 1 tablet (0.5 mg total) by mouth 2 (two) times daily as needed for anxiety.   MIRENA (52 MG) 20 MCG/24HR IUD Generic drug:  levonorgestrel Mirena 20 mcg/24 hr (5 years) intrauterine device   montelukast 10 MG tablet Commonly known as:  SINGULAIR Take 1 tablet (10 mg total) by mouth at bedtime.     omeprazole 40 MG capsule Commonly known as:  PRILOSEC Take 1 capsule (40 mg total) by mouth daily.   PROAIR HFA 108 (90 Base) MCG/ACT inhaler Generic drug:  albuterol INHALE 2 PUFFS INTO THE LUNGS EVERY 6 HOURS AS NEEDED FOR WHEEZING OR SHORTNESS OF BREATH       Review of systems negative except as noted in HPI / PMHx or noted below:  Review of Systems  Constitutional: Negative.   HENT: Negative.   Eyes: Negative.   Respiratory: Negative.   Cardiovascular: Negative.   Gastrointestinal: Negative.   Genitourinary: Negative.   Musculoskeletal: Negative.   Skin: Negative.   Neurological: Negative.   Endo/Heme/Allergies: Negative.   Psychiatric/Behavioral: Negative.     Family History  Problem Relation Age of Onset  . Asthma Mother   . Hypertension Father   . Hypertension Paternal Uncle   . Heart disease Maternal Grandmother   . Asthma Brother     Social History   Social History  . Marital status: Married    Spouse name: N/A  . Number of children: 3  . Years of education: N/A   Occupational History  . part time    Social History Main Topics  . Smoking status: Never Smoker  . Smokeless tobacco: Never Used  . Alcohol use Yes     Comment: 2-3 glasses of wine  . Drug use: No  . Sexual activity: No   Other Topics Concern  . Not on file   Social History Narrative  . No narrative on file    Environmental and Social history  Lives in a house with a dry environment, dogs located inside the household, would in the bedroom, no plastic on the bed, no plastic on the pillow, and no smokers located inside the household. She works part-time in [employer].  Objective:   Vitals:   07/04/17 0838  BP: 122/88  Pulse: 76  Resp: 16  Temp: 97.6 F (36.4 C)   Height: 5' 3.5" (161.3 cm) Weight: 241 lb 12.8 oz (109.7 kg)  Physical Exam  Constitutional: She is well-developed, well-nourished, and in no distress.  HENT:  Head: Normocephalic. Head is without right  periorbital erythema and without left periorbital erythema.  Right Ear: Tympanic membrane, external ear and ear canal normal.  Left Ear: Tympanic membrane and ear canal normal.  Nose: Nose normal. No mucosal edema or rhinorrhea.  Mouth/Throat: Uvula is midline, oropharynx is clear and moist and mucous membranes are normal. No oropharyngeal exudate.  Postsurgical changes external ear left  Eyes: Pupils are equal, round, and reactive to light. Conjunctivae and lids are normal.  Neck: Trachea normal. No tracheal tenderness present. No tracheal deviation present. No thyromegaly present.  Cardiovascular: Normal rate, regular rhythm, S1 normal, S2 normal and normal heart sounds.   No murmur heard. Pulmonary/Chest: Effort normal and breath sounds normal. No stridor. No tachypnea. No respiratory distress. She has no wheezes. She has no rales. She exhibits no tenderness.  Abdominal: Soft. She exhibits no distension and no mass. There is no hepatosplenomegaly. There is no tenderness. There is no rebound and no guarding.  Musculoskeletal: She exhibits no edema or tenderness.  Lymphadenopathy:       Head (right side): No tonsillar adenopathy present.       Head (left side): No tonsillar adenopathy present.    She has no cervical adenopathy.    She has no axillary adenopathy.  Neurological: She is alert. Gait normal.  Skin: Rash (Nasal bridge cystic lesions with erythema and telangiectasia) noted. She is not diaphoretic. No erythema. No pallor. Nails show no clubbing.  Psychiatric: Mood and affect normal.    Diagnostics: Allergy skin tests were performed. She demonstrated hypersensitivity to trees, grasses, weeds, house dust mite, cat, dog, and horse  Spirometry was performed and demonstrated an FEV1 of 2.89 @ 97 % of predicted. Following the administration of nebulized albuterol her FEV1 did not change significantly.    Results of blood tests performed 10/12/2016 identified a white blood cell count  11.6 with an absolute eosinophil count 200, absolute lymphocyte count 3200, hemoglobin 12.9, platelets 348.  Results of a chest x-ray obtained 02/24/2017 identified the following:  The heart size and mediastinal contours are within normal limits. Both lungs are clear. No pneumothorax or pleural effusion is noted. The visualized skeletal structures are unremarkable  Assessment and Plan:    1. Other allergic rhinitis   2. Not well controlled mild persistent asthma   3. Seasonal allergic conjunctivitis   4. Gastroesophageal reflux disease, esophagitis presence not specified   5. Rosacea     1. Allergen avoidance measures  2. Treat and prevent inflammation:   A. OTC Nasacort / Rhinocort - One spray each nostril one time per day  B. Asmanex 200 HFA - 2 inhalations one time per day  C. Montelukast 10mg  one tablet one time per day  3. Treat and prevent rosacea:   A. MetroCream applied twice a day to skin  4. If needed:   A. Zyrtec 10 mg twice a day  B. Patanol one drop each eye twice a day  C. Proair HFA 2 puffs very 4-6 hours  5. "Action plan" for "flareup":   A. increase Asmanex to 3 inhalations 3 times a day  B. start omeprazole 40 mg twice a day  C. start OTC Mucinex DM 2 tablets twice a day  D. continue Zyrtec, Patanol, Proventil HFA if needed  6. Attempt to avoid systemic steroids and antibiotics  7. Attempt to minimize caffeine and chocolate consumption  8. Immunotherapy?  9. Further treatment for rosacea?  10. Return to clinic in 4 weeks or earlier if problem  [place] has significant atopic respiratory disease for which she will attempt to perform allergen avoidance measures as best as possible and use anti-inflammatory agents for both her upper and lower airway as noted above. As well, it sounds as though she does rollover into reflux-induced respiratory disease when she starts to cough and I have given her a "action plan" which includes therapy directed  against reflux when she does develop one of her episodes. I think it is important to get her on a plan that helps her avoid the administration of systemic steroids and antibiotics multiple times a year and she appeared to understand this treatment concept during today's visit. She would definitely be a candidate for immunotherapy and I have given her literature on this form of treatment during today's visit. She also appears to have rosacea  and will start of therapy with MetroCream and if this is ineffective in treating this issue we will give her doxycycline. I will regroup with her in 4 weeks.  Valerie Priest, MD Allergy / Immunology [facility] [place]

## 2017-07-04 NOTE — Progress Notes (Signed)
Subjective:    Patient ID: Valerie Burke, female    DOB: [birth year], 41 y.o.   MRN: [id number]  HPI  Pt in for urinary symptoms since Friday night.  Pt in today reporting urinary symptoms.  Dysuria- some slight pain on urination at the end of flow. Sharp severe pain. Frequent urination-feels like has to urinate more but not urinating more., States needs to go but fighting urge. Hesitancy-yes Suprapubic pressure- mild. Fever-no chills-no Nausea-no Vomiting-no CVA pain-none. History of UTI- occasional rare uti. Gross hematuria-none.  LMP- Pt has mirena. Pt thinks sometimes thinks can feel slight tug sensation around ovaries but not severe pain. Had mirena in for 2 years.      Review of Systems  Constitutional: Negative for chills, fatigue and fever.  Respiratory: Negative for cough, chest tightness, shortness of breath and wheezing.   Cardiovascular: Negative for chest pain and palpitations.  Gastrointestinal: Negative for abdominal pain.  Genitourinary: Positive for dysuria and urgency. Negative for flank pain, frequency, hematuria, vaginal bleeding and vaginal pain.       See hpi.  Musculoskeletal: Negative for back pain.  Skin: Negative for rash.  Neurological: Negative for dizziness, seizures, speech difficulty, weakness, numbness and headaches.  Hematological: Negative for adenopathy. Does not bruise/bleed easily.  Psychiatric/Behavioral: Negative for behavioral problems and confusion. The patient is nervous/anxious.        Rare occasional benzo needed associated with anxiety related to 3 children. School year more stressful per pt.   Past Medical History:  Diagnosis Date  . Allergy   . Anemia   . Anxiety   . GERD (gastroesophageal reflux disease)   . OSA (obstructive sleep apnea) 02/03/2017     Social History   Social History  . Marital status: Married    Spouse name: N/A  . Number of children: 3  . Years of education: N/A   Occupational History  . part  time    Social History Main Topics  . Smoking status: Never Smoker  . Smokeless tobacco: Never Used  . Alcohol use Yes     Comment: 2-3 glasses of wine  . Drug use: No  . Sexual activity: No   Other Topics Concern  . Not on file   Social History Narrative  . No narrative on file    Past Surgical History:  Procedure Laterality Date  . WISDOM TOOTH EXTRACTION     age 44    Family History  Problem Relation Age of Onset  . Asthma Mother   . Hypertension Father   . Hypertension Paternal Uncle   . Heart disease Maternal Grandmother   . Asthma Brother     Allergies  Allergen Reactions  . Levofloxacin Rash    Current Outpatient Prescriptions on File Prior to Visit  Medication Sig Dispense Refill  . azelastine (ASTELIN) 0.1 % nasal spray Place 2 sprays into both nostrils 2 (two) times daily. Use in each nostril as directed (Patient not taking: Reported on 05/19/2017) 30 mL 3  . cetirizine (ZYRTEC) 10 MG tablet Take 10 mg by mouth daily.    . clonazePAM (KLONOPIN) 0.5 MG tablet Take 1 tablet (0.5 mg total) by mouth 2 (two) times daily as needed for anxiety. 14 tablet 0  . levonorgestrel (MIRENA, 52 MG,) 20 MCG/24HR IUD Mirena 20 mcg/24 hr (5 years) intrauterine device    . metroNIDAZOLE (METROCREAM) 0.75 % cream Apply to face twice daily as directed. 90 g 5  . Mometasone Furoate (ASMANEX HFA) 200 MCG/ACT AERO  Inhale 2 puffs into the lungs daily. Increase to 3 puffs 3 times daily during flare-up. Rinse, gargle, and spit after use. 1 Inhaler 5  . montelukast (SINGULAIR) 10 MG tablet Take 1 tablet (10 mg total) by mouth at bedtime. 30 tablet 5  . olopatadine (PATANOL) 0.1 % ophthalmic solution Can use one drop in each eye twice daily if needed for itchy eyes. 5 mL 5  . omeprazole (PRILOSEC) 40 MG capsule Take one capsule twice daily as directed during flare-up. 60 capsule 3  . PROAIR HFA 108 (90 Base) MCG/ACT inhaler INHALE 2 PUFFS INTO THE LUNGS EVERY 6 HOURS AS NEEDED FOR WHEEZING  OR SHORTNESS OF BREATH 8.5 g 0   No current facility-administered medications on file prior to visit.     BP 130/77 (BP Location: Right Arm, Patient Position: Sitting, Cuff Size: Large)   Pulse 92   Temp 98.4 F (36.9 C) (Oral)   Ht 5' (1.524 m)   Wt 241 lb 6.4 oz (109.5 kg)   SpO2 100%   BMI 47.15 kg/m       Objective:   Physical Exam  General Appearance- Not in acute distress.  HEENT Eyes- Scleraeral/Conjuntiva-bilat- Not Yellow. Mouth & Throat- Normal.  Chest and Lung Exam Auscultation: Breath sounds:-Normal. Adventitious sounds:- No Adventitious sounds.  Cardiovascular Auscultation:Rythm - Regular. Heart Sounds -Normal heart sounds.  Abdomen Inspection:-Inspection Normal.  Palpation/Perucssion: Palpation and Percussion of the abdomen reveal- faint suprapubic Tender, No Rebound tenderness, No rigidity(Guarding) and No Palpable abdominal masses.  Liver:-Normal.  Spleen:- Normal.   Back- no cva pain.     Assessment & Plan:  Your appear to have a urinary tract infection. I am prescribing macrobid antibiotic for the probable infection. Hydrate well. I am sending out a urine culture. During the interim if your signs and symptoms worsen rather than improving please notify us. We will notify your when the culture results are back.  For anxiety will refill your clonazepam. 14 tabs used since April. Will refill today. Continue very occasional/rare use prn anxiety.  Follow up in 7 days or as needed.  Fionnuala Hemmerich, Ramon Dredge, PA-C

## 2017-07-05 ENCOUNTER — Encounter: Payer: Self-pay | Admitting: Allergy and Immunology

## 2017-07-07 LAB — URINE CULTURE

## 2017-07-14 ENCOUNTER — Ambulatory Visit: Payer: 59 | Admitting: Adult Health

## 2017-08-01 ENCOUNTER — Ambulatory Visit (INDEPENDENT_AMBULATORY_CARE_PROVIDER_SITE_OTHER): Payer: 59 | Admitting: Allergy and Immunology

## 2017-08-01 ENCOUNTER — Encounter: Payer: Self-pay | Admitting: Allergy and Immunology

## 2017-08-01 VITALS — BP 126/78 | HR 90 | Resp 17

## 2017-08-01 DIAGNOSIS — H1045 Other chronic allergic conjunctivitis: Secondary | ICD-10-CM | POA: Diagnosis not present

## 2017-08-01 DIAGNOSIS — L719 Rosacea, unspecified: Secondary | ICD-10-CM

## 2017-08-01 DIAGNOSIS — J4530 Mild persistent asthma, uncomplicated: Secondary | ICD-10-CM | POA: Diagnosis not present

## 2017-08-01 DIAGNOSIS — K219 Gastro-esophageal reflux disease without esophagitis: Secondary | ICD-10-CM

## 2017-08-01 DIAGNOSIS — H1010 Acute atopic conjunctivitis, unspecified eye: Secondary | ICD-10-CM

## 2017-08-01 DIAGNOSIS — J3089 Other allergic rhinitis: Secondary | ICD-10-CM

## 2017-08-01 NOTE — Progress Notes (Signed)
Follow-up Note  Referring Provider: Esperanza Richters, PA-C Primary Provider: Marisue Brooklyn Date of Office Visit: 08/01/2017  Subjective:   Valerie Burke (DOB: 07-Sep-1976) is a 41 y.o. female who [facility] the [facility] on 08/01/2017 in re-evaluation of the following:  HPI: Valerie Burke returns to this clinic in evaluation of Valerie Burke multiorgan atopic respiratory disease and rosacea. I last saw Valerie Burke in this clinic during Valerie Burke initial evaluation of 07/04/2017 at which point in time we made a plan to address these issues.  Overall she feels very good at this point in time. She has very little issues with Valerie Burke nose or eyes or Valerie Burke chest. She has no need to use a short acting bronchodilator and she has not had one of Valerie Burke episodes of "sinusitis with bronchitis". She has not had a need to activate Valerie Burke action plan for these episodes.  She has not really performed any allergen avoidance measures against house dust mite and the dogs still remain inside the bedroom at nighttime.  She believes that Valerie Burke skin on Valerie Burke face is doing much better on Valerie Burke MetroCream.  Allergies as of 08/01/2017      Reactions   Levofloxacin Rash      Medication List      azelastine 0.1 % nasal spray Commonly known as:  ASTELIN Place 2 sprays into both nostrils 2 (two) times daily. Use in each nostril as directed   cetirizine 10 MG tablet Commonly known as:  ZYRTEC Take 10 mg by mouth daily.   clonazePAM 0.5 MG tablet Commonly known as:  KLONOPIN Take 1 tablet (0.5 mg total) by mouth 2 (two) times daily as needed for anxiety.   metroNIDAZOLE 0.75 % cream Commonly known as:  METROCREAM Apply to face twice daily as directed.   MIRENA (52 MG) 20 MCG/24HR IUD Generic drug:  levonorgestrel Mirena 20 mcg/24 hr (5 years) intrauterine device   Mometasone Furoate 200 MCG/ACT Aero Commonly known as:  ASMANEX HFA Inhale 2 puffs into the lungs daily. Increase to 3 puffs 3 times daily during flare-up.  Rinse, gargle, and spit after use.   montelukast 10 MG tablet Commonly known as:  SINGULAIR Take 1 tablet (10 mg total) by mouth at bedtime.   nitrofurantoin (macrocrystal-monohydrate) 100 MG capsule Commonly known as:  MACROBID Take 1 capsule (100 mg total) by mouth 2 (two) times daily.   olopatadine 0.1 % ophthalmic solution Commonly known as:  PATANOL Can use one drop in each eye twice daily if needed for itchy eyes.   omeprazole 40 MG capsule Commonly known as:  PRILOSEC Take one capsule twice daily as directed during flare-up.   PROAIR HFA 108 (90 Base) MCG/ACT inhaler Generic drug:  albuterol INHALE 2 PUFFS INTO THE LUNGS EVERY 6 HOURS AS NEEDED FOR WHEEZING OR SHORTNESS OF BREATH       Past Medical History:  Diagnosis Date  . Allergy   . Anemia   . Anxiety   . GERD (gastroesophageal reflux disease)   . OSA (obstructive sleep apnea) 02/03/2017    Past Surgical History:  Procedure Laterality Date  . WISDOM TOOTH EXTRACTION     age 45    Review of systems negative except as noted in HPI / PMHx or noted below:  Review of Systems  Constitutional: Negative.   HENT: Negative.   Eyes: Negative.   Respiratory: Negative.   Cardiovascular: Negative.   Gastrointestinal: Negative.   Genitourinary: Negative.   Musculoskeletal: Negative.   Skin:  Negative.   Neurological: Negative.   Endo/Heme/Allergies: Negative.   Psychiatric/Behavioral: Negative.      Objective:   Vitals:   08/01/17 0939  BP: 126/78  Pulse: 90  Resp: 17  SpO2: 97%          Physical Exam  Constitutional: She is well-developed, well-nourished, and in no distress.  HENT:  Head: Normocephalic.  Right Ear: Tympanic membrane, external ear and ear canal normal.  Left Ear: Tympanic membrane, external ear and ear canal normal.  Nose: Nose normal. No mucosal edema or rhinorrhea.  Mouth/Throat: Uvula is midline, oropharynx is clear and moist and mucous membranes are normal. No oropharyngeal  exudate.  Eyes: Conjunctivae are normal.  Neck: Trachea normal. No tracheal tenderness present. No tracheal deviation present. No thyromegaly present.  Cardiovascular: Normal rate, regular rhythm, S1 normal, S2 normal and normal heart sounds.   No murmur heard. Pulmonary/Chest: Breath sounds normal. No stridor. No respiratory distress. She has no wheezes. She has no rales.  Musculoskeletal: She exhibits no edema.  Lymphadenopathy:       Head (right side): No tonsillar adenopathy present.       Head (left side): No tonsillar adenopathy present.    She has no cervical adenopathy.  Neurological: She is alert. Gait normal.  Skin: Rash (facial erythema and telangiectasia) noted. She is not diaphoretic. No erythema. Nails show no clubbing.  Psychiatric: Mood and affect normal.    Diagnostics:    Spirometry was performed and demonstrated an FEV1 of 2.85 at 102 % of predicted.  Assessment and Plan:   1. Asthma, well controlled, mild persistent   2. Other allergic rhinitis   3. Seasonal allergic conjunctivitis   4. Gastroesophageal reflux disease, esophagitis presence not specified   5. Rosacea     1. Perform Allergen avoidance measures against house dust mite and dog  2. Continue to Treat and prevent inflammation:   A. OTC Nasacort / Rhinocort - One spray each nostril one time per day  B. Asmanex 200 HFA - 2 inhalations one time per day  C. Montelukast  one tablet one time per day  3. Continue to Treat and prevent rosacea:   A. MetroCream applied twice a day to skin  4. If needed:   A. Zyrtec 10 mg twice a day  B. Patanol one drop each eye twice a day  C. Proair HFA 2 puffs very 4-6 hours  5. "Action plan" for "flareup":   A. increase Asmanex to 3 inhalations 3 times a day  B. start omeprazole 40 mg twice a day  C. start OTC Mucinex DM 2 tablets twice a day  D. continue Zyrtec, Patanol, Proventil HFA if needed  6. Attempt to minimize caffeine and chocolate  consumption  7. Immunotherapy?  8. Obtain fall flu vaccine  9. Return to clinic in December 2018 or earlier if problem  Valerie Burke appears to be doing better regarding Valerie Burke atopic respiratory disease on Valerie Burke current medical plan. I did encourage Valerie Burke to perform allergen avoidance measures against house dust mite and Valerie Burke dogs. I recommended that she removed the dog from the bedroom, wash the dogs every week, and put a HEPA filter in Valerie Burke bedroom with the door shut. She is a candidate for immunotherapy and she understands that this is an option and she is presently considering this option. She will continue to use MetroCream for Valerie Burke rosacea. For Valerie Burke "sinusitis with bronchitis" episodes she will utilize the action plan noted above which does include treatment for  reflux as she apparently does roll over into a component of reflux whenever she is coughing during these episodes. I will see Valerie Burke back in this clinic in December 2018 or earlier if there is a problem.  Laurette Schimke, MD Allergy / Immunology [facility]

## 2017-08-01 NOTE — Patient Instructions (Signed)
  1. Perform Allergen avoidance measures against house dust mite and dog  2. Continue to Treat and prevent inflammation:   A. OTC Nasacort / Rhinocort - One spray each nostril one time per day  B. Asmanex 200 HFA - 2 inhalations one time per day  C. Montelukast  one tablet one time per day  3. Continue to Treat and prevent rosacea:   A. MetroCream applied twice a day to skin  4. If needed:   A. Zyrtec 10 mg twice a day  B. Patanol one drop each eye twice a day  C. Proair HFA 2 puffs very 4-6 hours  5. "Action plan" for "flareup":   A. increase Asmanex to 3 inhalations 3 times a day  B. start omeprazole 40 mg twice a day  C. start OTC Mucinex DM 2 tablets twice a day  D. continue Zyrtec, Patanol, Proventil HFA if needed  6. Attempt to minimize caffeine and chocolate consumption  7. Immunotherapy?  8. Obtain fall flu vaccine  9. Return to clinic in December 2018 or earlier if problem

## 2017-10-24 ENCOUNTER — Other Ambulatory Visit: Payer: Self-pay | Admitting: Family

## 2018-02-09 ENCOUNTER — Encounter: Payer: Self-pay | Admitting: Medical

## 2018-02-09 ENCOUNTER — Ambulatory Visit: Payer: BLUE CROSS/BLUE SHIELD | Admitting: Medical

## 2018-02-09 VITALS — BP 115/78 | HR 77 | Temp 98.0°F | Resp 16 | Ht 63.5 in | Wt 240.0 lb

## 2018-02-09 DIAGNOSIS — H6122 Impacted cerumen, left ear: Secondary | ICD-10-CM | POA: Diagnosis not present

## 2018-02-09 DIAGNOSIS — J301 Allergic rhinitis due to pollen: Secondary | ICD-10-CM

## 2018-02-09 DIAGNOSIS — J342 Deviated nasal septum: Secondary | ICD-10-CM | POA: Diagnosis not present

## 2018-02-09 DIAGNOSIS — F419 Anxiety disorder, unspecified: Secondary | ICD-10-CM

## 2018-02-09 DIAGNOSIS — H9312 Tinnitus, left ear: Secondary | ICD-10-CM | POA: Diagnosis not present

## 2018-02-09 DIAGNOSIS — J029 Acute pharyngitis, unspecified: Secondary | ICD-10-CM

## 2018-02-09 DIAGNOSIS — J0100 Acute maxillary sinusitis, unspecified: Secondary | ICD-10-CM | POA: Diagnosis not present

## 2018-02-09 DIAGNOSIS — H938X2 Other specified disorders of left ear: Secondary | ICD-10-CM | POA: Diagnosis not present

## 2018-02-09 LAB — POCT RAPID STREP A (OFFICE): RAPID STREP A SCREEN: NEGATIVE

## 2018-02-09 MED ORDER — MONTELUKAST SODIUM 10 MG PO TABS: ORAL_TABLET | ORAL | 5 refills | Status: DC

## 2018-02-09 MED ORDER — METHYLPREDNISOLONE ACETATE 40 MG/ML IJ SUSP
40.0000 mg | Freq: Once | INTRAMUSCULAR | Status: AC
  Administered 2018-02-09: 40 mg via INTRAMUSCULAR

## 2018-02-09 MED ORDER — CLONAZEPAM 0.5 MG PO TABS: 0.5000 mg | ORAL_TABLET | Freq: Two times a day (BID) | ORAL | 0 refills | Status: DC | PRN

## 2018-02-09 NOTE — Progress Notes (Signed)
Subjective:    Patient ID: Valerie Burke, female    DOB: 11/30/1975, 42 y.o.   MRN: [id number]  HPI  Pt has 2 days of nasal congestion for 2 days. Now sinus pressure and ear pain.   Pt does not hx of chronic intermittent sharp ear pain that occurs without uri or allergy symptoms. Can occur with both. Worse on left side. Some ringing as well.  Pt has not been on her zyrtec or montelkast.  Review of Systems  Constitutional: Negative for chills and fatigue.  HENT: Positive for congestion, ear pain and postnasal drip. Negative for sinus pressure, sinus pain and sore throat.        Some colored mucus at times when blows nose but more clear.  Respiratory: Positive for cough. Negative for chest tightness, shortness of breath and wheezing.   Cardiovascular: Negative for chest pain and palpitations.  Gastrointestinal: Negative for abdominal distention, abdominal pain, diarrhea, nausea and vomiting.  Musculoskeletal: Negative for back pain, myalgias, neck pain and neck stiffness.  Skin: Negative for rash.  Neurological: Negative for dizziness and headaches.  Hematological: Negative for adenopathy. Does not bruise/bleed easily.  Psychiatric/Behavioral: Negative for behavioral problems, confusion, dysphoric mood and hallucinations. The patient is not nervous/anxious.        Rare severe anxiety. Used 14 benzo tabs since august. Would like refil.    Past Medical History:  Diagnosis Date  . Allergy   . Anemia   . Anxiety   . GERD (gastroesophageal reflux disease)   . OSA (obstructive sleep apnea) 02/03/2017     Social History   Socioeconomic History  . Marital status: Married    Spouse name: Not on file  . Number of children: 3  . Years of education: Not on file  . Highest education level: Not on file  Occupational History  . Occupation: part time  Social Needs  . Financial resource strain: Not on file  . Food insecurity:    Worry: Not on file    Inability: Not on file  .  Transportation needs:    Medical: Not on file    Non-medical: Not on file  Tobacco Use  . Smoking status: Never Smoker  . Smokeless tobacco: Never Used  Substance and Sexual Activity  . Alcohol use: Yes    Comment: 2-3 glasses of wine  . Drug use: No  . Sexual activity: Never    Birth control/protection: IUD  Lifestyle  . Physical activity:    Days per week: Not on file    Minutes per session: Not on file  . Stress: Not on file  Relationships  . Social connections:    Talks on phone: Not on file    Gets together: Not on file    Attends religious service: Not on file    Active member of club or organization: Not on file    Attends meetings of clubs or organizations: Not on file    Relationship status: Not on file  . Intimate partner violence:    Fear of current or ex partner: Not on file    Emotionally abused: Not on file    Physically abused: Not on file    Forced sexual activity: Not on file  Other Topics Concern  . Not on file  Social History Narrative  . Not on file    Past Surgical History:  Procedure Laterality Date  . WISDOM TOOTH EXTRACTION     age 42    Family History  Problem  Relation Age of Onset  . Asthma Mother   . Hypertension Father   . Hypertension Paternal Uncle   . Heart disease Maternal Grandmother   . Asthma Brother     Allergies  Allergen Reactions  . Levofloxacin Rash    Current Outpatient Medications on File Prior to Visit  Medication Sig Dispense Refill  . cetirizine (ZYRTEC) 10 MG tablet Take 10 mg by mouth daily.    . clonazePAM (KLONOPIN) 0.5 MG tablet Take 1 tablet (0.5 mg total) by mouth 2 (two) times daily as needed for anxiety. 14 tablet 0  . levonorgestrel (MIRENA, 52 MG,) 20 MCG/24HR IUD Mirena 20 mcg/24 hr (5 years) intrauterine device    . levonorgestrel (MIRENA, 52 MG,) 20 MCG/24HR IUD Mirena 20 mcg/24 hours (5 yrs) 52 mg intrauterine device    . montelukast (SINGULAIR) 10 MG tablet TAKE 1 TABLET BY MOUTH EVERYDAY AT  BEDTIME 30 tablet 5  . PROAIR HFA 108 (90 Base) MCG/ACT inhaler INHALE 2 PUFFS INTO THE LUNGS EVERY 6 HOURS AS NEEDED FOR WHEEZING OR SHORTNESS OF BREATH 8.5 g 0   No current facility-administered medications on file prior to visit.     BP 115/78 (BP Location: Left Arm, Patient Position: Sitting, Cuff Size: Small)   Pulse 77   Temp 98 F (36.7 C) (Oral)   Resp 16   Ht 5' 3.5" (1.613 m)   Wt 240 lb (108.9 kg)   SpO2 100%   BMI 41.85 kg/m       Objective:   Physical Exam  General  Mental Status - Alert. General Appearance - Well groomed. Not in acute distress.  Skin Rashes- No Rashes.  HEENT Head- Normal. Ear Auditory Canal - Left-has moderate to severe cerumen impaction.  Right - Normal.Tympanic Membrane- Left-not seen. Right- Normal. Eye Sclera/Conjunctiva- Left- Normal. Right- Normal. Nose & Sinuses Nasal Mucosa- Left-  Boggy and Congested. Right-  Boggy and  Congested.Bilateral maxillary and frontal sinus pressure.(Does appear to have deviated septum towards the left side) Mouth & Throat Lips: Upper Lip- Normal: no dryness, cracking, pallor, cyanosis, or vesicular eruption. Lower Lip-Normal: no dryness, cracking, pallor, cyanosis or vesicular eruption. Buccal Mucosa- Bilateral- No Aphthous ulcers. Oropharynx- No Discharge or Erythema. Tonsils: Characteristics- Bilateral- No Erythema or Congestion. Size/Enlargement- Bilateral- No enlargement. Discharge- bilateral-None.  Neck Neck- Supple. No Masses.   Chest and Lung Exam Auscultation: Breath Sounds:-Clear even and unlabored.  Cardiovascular Auscultation:Rythm- Regular, rate and rhythm. Murmurs & Other Heart Sounds:Ausculatation of the heart reveal- No Murmurs.  Lymphatic Head & Neck General Head & Neck Lymphatics: Bilateral: Description- No Localized lymphadenopathy.      Assessment & Plan:  You do appear to have probable recent allergic rhinitis flare with sinus infection as well.  Please start back on  your antihistamine and Singulair.  I am prescribing Augmentin antibiotic.  Since she cannot tolerate nasal sprays for allergies, we gave you Depo-Medrol 40 mg IM injection today.  For left-sided cerumen impaction recommend getting Debrox over-the-counter and use daily.  Then in 5-7 days recommend scheduling appointment for lavage of that ear.  For history of anxiety and very rare use of benzodiazepine, I did refill prescription of 14 tablets only today.  Follow-up in 5-7 days or as needed.  Esperanza Richters, PA-C

## 2018-02-09 NOTE — Patient Instructions (Signed)
You do appear to have probable recent allergic rhinitis flare with sinus infection as well.  Please start back on your antihistamine and Singulair.  I am prescribing Augmentin antibiotic.  Since she cannot tolerate nasal sprays for allergies, we gave you Depo-Medrol 40 mg IM injection today.  For left-sided cerumen impaction recommend getting Debrox over-the-counter and use daily.  Then in 5-7 days recommend scheduling appointment for lavage of that ear.  For history of anxiety and very rare use of benzodiazepine, I did refill prescription of 14 tablets only today.  Follow-up in 5-7 days or as needed.

## 2018-02-13 ENCOUNTER — Telehealth: Payer: Self-pay | Admitting: Medical

## 2018-02-13 ENCOUNTER — Ambulatory Visit: Payer: BLUE CROSS/BLUE SHIELD | Admitting: Medical

## 2018-02-13 ENCOUNTER — Encounter: Payer: Self-pay | Admitting: Medical

## 2018-02-13 ENCOUNTER — Telehealth: Payer: Self-pay

## 2018-02-13 VITALS — BP 115/78 | HR 69 | Temp 98.1°F | Resp 16 | Ht 63.0 in | Wt 241.2 lb

## 2018-02-13 DIAGNOSIS — H6122 Impacted cerumen, left ear: Secondary | ICD-10-CM

## 2018-02-13 DIAGNOSIS — H6690 Otitis media, unspecified, unspecified ear: Secondary | ICD-10-CM | POA: Diagnosis not present

## 2018-02-13 DIAGNOSIS — J3489 Other specified disorders of nose and nasal sinuses: Secondary | ICD-10-CM | POA: Diagnosis not present

## 2018-02-13 DIAGNOSIS — J301 Allergic rhinitis due to pollen: Secondary | ICD-10-CM | POA: Diagnosis not present

## 2018-02-13 MED ORDER — AMOXICILLIN-POT CLAVULANATE 875-125 MG PO TABS: 1.0000 | ORAL_TABLET | Freq: Two times a day (BID) | ORAL | 0 refills | Status: DC

## 2018-02-13 MED ORDER — PREDNISONE 10 MG PO TABS: ORAL_TABLET | ORAL | 0 refills | Status: DC

## 2018-02-13 MED FILL — AMOX-CLAV 875-125 MG TABLET: 875-125 | 10 days supply | Qty: 20 | Fill #0

## 2018-02-13 MED FILL — predniSONE 10 MG TABS: 10 | 4 days supply | Qty: 10 | Fill #0

## 2018-02-13 NOTE — Patient Instructions (Signed)
For persisting allergies and congestion did rx 4 day taper of prednisone. Continue zyrtec and singulair.  For sinus infection and left ear infection start augmentin.  Wax cleared today.  If no one notified you on ENT referral by one week please call here for update or my chart [place].  Follow up 10-14 days or as needed

## 2018-02-13 NOTE — Telephone Encounter (Signed)
Patient seen today for ear lavage.  Augmentin given to patient today.  Was a printed prescription.

## 2018-02-13 NOTE — Telephone Encounter (Signed)
Pt called last night- her pharmacy did not receive the Augmentin that was to be prescribed at yesterdays office visit.

## 2018-02-13 NOTE — Telephone Encounter (Signed)
Augmentin was not sent to pt's pharmacy

## 2018-02-13 NOTE — Telephone Encounter (Signed)
Pt blood pressure was not documented yesterday. If you would remind me to look in shred bin so we can get that number. And I can addendum vitals.

## 2018-02-13 NOTE — Progress Notes (Signed)
Subjective:    Patient ID: Valerie Burke, female    DOB: 05/17/1976, 42 y.o.   MRN: [id number]  HPI   Pt in for follow up.  Pt did get her ear lavaged today. She never got antibiotic filled. Partly since she did not try to get it filled until saturday and when she did try to fill the prescription it was not at the pharmacy.(discussed with pt and admitted my mistake)  She state the steroid injection did help the other day. Sinus pressure was relieved some but still present today). Pt states still has ear pressure/pain even now.     Review of Systems  Constitutional: Negative for chills, fatigue and fever.  HENT: Positive for congestion, ear pain, sinus pressure and sinus pain. Negative for mouth sores, postnasal drip and rhinorrhea.   Respiratory: Negative for cough, chest tightness, shortness of breath and wheezing.   Cardiovascular: Negative for chest pain and palpitations.  Gastrointestinal: Negative for abdominal pain, anal bleeding, constipation and nausea.  Musculoskeletal: Negative for back pain.  Neurological: Negative for dizziness, syncope, numbness and headaches.  Hematological: Negative for adenopathy. Does not bruise/bleed easily.  Psychiatric/Behavioral: Negative for behavioral problems and confusion. The patient is not nervous/anxious.      Past Medical History:  Diagnosis Date  . Allergy   . Anemia   . Anxiety   . GERD (gastroesophageal reflux disease)   . OSA (obstructive sleep apnea) 02/03/2017     Social History   Socioeconomic History  . Marital status: Married    Spouse name: Not on file  . Number of children: 3  . Years of education: Not on file  . Highest education level: Not on file  Occupational History  . Occupation: part time  Social Needs  . Financial resource strain: Not on file  . Food insecurity:    Worry: Not on file    Inability: Not on file  . Transportation needs:    Medical: Not on file    Non-medical: Not on file  Tobacco  Use  . Smoking status: Never Smoker  . Smokeless tobacco: Never Used  Substance and Sexual Activity  . Alcohol use: Yes    Comment: 2-3 glasses of wine  . Drug use: No  . Sexual activity: Never    Birth control/protection: IUD  Lifestyle  . Physical activity:    Days per week: Not on file    Minutes per session: Not on file  . Stress: Not on file  Relationships  . Social connections:    Talks on phone: Not on file    Gets together: Not on file    Attends religious service: Not on file    Active member of club or organization: Not on file    Attends meetings of clubs or organizations: Not on file    Relationship status: Not on file  . Intimate partner violence:    Fear of current or ex partner: Not on file    Emotionally abused: Not on file    Physically abused: Not on file    Forced sexual activity: Not on file  Other Topics Concern  . Not on file  Social History Narrative  . Not on file    Past Surgical History:  Procedure Laterality Date  . WISDOM TOOTH EXTRACTION     age 89    Family History  Problem Relation Age of Onset  . Asthma Mother   . Hypertension Father   . Hypertension Paternal Uncle   .  Heart disease Maternal Grandmother   . Asthma Brother     Allergies  Allergen Reactions  . Levofloxacin Rash    Current Outpatient Medications on File Prior to Visit  Medication Sig Dispense Refill  . cetirizine (ZYRTEC) 10 MG tablet Take 10 mg by mouth daily.    . clonazePAM (KLONOPIN) 0.5 MG tablet Take 1 tablet (0.5 mg total) by mouth 2 (two) times daily as needed for anxiety. 14 tablet 0  . levonorgestrel (MIRENA, 52 MG,) 20 MCG/24HR IUD Mirena 20 mcg/24 hr (5 years) intrauterine device    . levonorgestrel (MIRENA, 52 MG,) 20 MCG/24HR IUD Mirena 20 mcg/24 hours (5 yrs) 52 mg intrauterine device    . montelukast (SINGULAIR) 10 MG tablet TAKE 1 TABLET BY MOUTH EVERYDAY AT BEDTIME 30 tablet 5  . PROAIR HFA 108 (90 Base) MCG/ACT inhaler INHALE 2 PUFFS INTO THE  LUNGS EVERY 6 HOURS AS NEEDED FOR WHEEZING OR SHORTNESS OF BREATH 8.5 g 0   No current facility-administered medications on file prior to visit.     Pulse 69   Temp 98.1 F (36.7 C) (Oral)   Resp 16   Ht 5\' 3"  (1.6 m)   Wt 241 lb 3.2 oz (109.4 kg)   SpO2 100%   BMI 42.73 kg/m       Objective:   Physical Exam  General  Mental Status - Alert. General Appearance - Well groomed. Not in acute distress.  Skin Rashes- No Rashes.  HEENT Head- Normal. Ear Auditory Canal - Left- Normal. Right - Normal.Tympanic Membrane- Left- after lavage of tim wax cleared but tm is red Right- Normal. Eye Sclera/Conjunctiva- Left- Normal. Right- Normal. Nose & Sinuses Nasal Mucosa- Left-  Boggy and Congested. Right-  Boggy and  Congested.Bilateral maxillary and frontal sinus pressure. Mouth & Throat Lips: Upper Lip- Normal: no dryness, cracking, pallor, cyanosis, or vesicular eruption. Lower Lip-Normal: no dryness, cracking, pallor, cyanosis or vesicular eruption. Buccal Mucosa- Bilateral- No Aphthous ulcers. Oropharynx- No Discharge or Erythema. Tonsils: Characteristics- Bilateral- No Erythema or Congestion. Size/Enlargement- Bilateral- No enlargement. Discharge- bilateral-None.  Neck Neck- Supple. No Masses.   Chest and Lung Exam Auscultation: Breath Sounds:-Clear even and unlabored.  Cardiovascular Auscultation:Rythm- Regular, rate and rhythm. Murmurs & Other Heart Sounds:Ausculatation of the heart reveal- No Murmurs.  Lymphatic Head & Neck General Head & Neck Lymphatics: Bilateral: Description- No Localized lymphadenopathy.      Assessment & Plan:  For persisting allergies and congestion did rx 4 day taper of prednisone. Continue zyrtec and singulair.  For sinus infection and left ear infection start augmentin.  Wax cleared today.  If no one notified you on ENT referral by one week please call here for update or my chart [place].(see prior notes regarding referral)  Follow up  10-14 days or as needed  [employer], [employer]

## 2018-02-14 ENCOUNTER — Encounter: Payer: Self-pay | Admitting: Medical

## 2018-02-15 NOTE — Telephone Encounter (Signed)
BP in

## 2018-04-12 ENCOUNTER — Telehealth: Payer: Self-pay

## 2018-04-12 ENCOUNTER — Telehealth: Payer: Self-pay | Admitting: Medical

## 2018-04-12 DIAGNOSIS — H9312 Tinnitus, left ear: Secondary | ICD-10-CM

## 2018-04-12 NOTE — Telephone Encounter (Signed)
Referral placed sent to [facility].

## 2018-04-12 NOTE — Telephone Encounter (Signed)
New referral to ENT placed. °

## 2018-04-12 NOTE — Telephone Encounter (Signed)
Copied from CRM [id number]. Topic: Referral - Request >> Apr 11, 2018  2:46 PM Arlyss Gandy, NT wrote: Reason for CRM: Pt would like a new referral for an ENT sent to an office in [place] other that [facility].

## 2018-05-07 DIAGNOSIS — Z0110 Encounter for examination of ears and hearing without abnormal findings: Secondary | ICD-10-CM | POA: Diagnosis not present

## 2018-05-07 DIAGNOSIS — J342 Deviated nasal septum: Secondary | ICD-10-CM | POA: Diagnosis not present

## 2018-05-07 DIAGNOSIS — L299 Pruritus, unspecified: Secondary | ICD-10-CM | POA: Diagnosis not present

## 2018-05-07 DIAGNOSIS — J358 Other chronic diseases of tonsils and adenoids: Secondary | ICD-10-CM | POA: Diagnosis not present

## 2018-05-07 DIAGNOSIS — H9313 Tinnitus, bilateral: Secondary | ICD-10-CM | POA: Diagnosis not present

## 2018-09-17 DIAGNOSIS — R05 Cough: Secondary | ICD-10-CM | POA: Diagnosis not present

## 2018-09-17 DIAGNOSIS — R062 Wheezing: Secondary | ICD-10-CM | POA: Diagnosis not present

## 2019-01-24 ENCOUNTER — Ambulatory Visit: Payer: BLUE CROSS/BLUE SHIELD | Admitting: Medical

## 2019-01-25 ENCOUNTER — Other Ambulatory Visit: Payer: Self-pay

## 2019-01-25 ENCOUNTER — Encounter: Payer: Self-pay | Admitting: Medical

## 2019-01-25 ENCOUNTER — Ambulatory Visit: Payer: BLUE CROSS/BLUE SHIELD | Admitting: Medical

## 2019-01-25 VITALS — BP 133/81 | HR 78 | Temp 78.0°F | Resp 16 | Ht 63.0 in | Wt 250.8 lb

## 2019-01-25 DIAGNOSIS — J029 Acute pharyngitis, unspecified: Secondary | ICD-10-CM | POA: Diagnosis not present

## 2019-01-25 DIAGNOSIS — R490 Dysphonia: Secondary | ICD-10-CM | POA: Diagnosis not present

## 2019-01-25 DIAGNOSIS — L299 Pruritus, unspecified: Secondary | ICD-10-CM

## 2019-01-25 DIAGNOSIS — J0100 Acute maxillary sinusitis, unspecified: Secondary | ICD-10-CM | POA: Diagnosis not present

## 2019-01-25 DIAGNOSIS — R509 Fever, unspecified: Secondary | ICD-10-CM | POA: Diagnosis not present

## 2019-01-25 DIAGNOSIS — F419 Anxiety disorder, unspecified: Secondary | ICD-10-CM

## 2019-01-25 LAB — POCT RAPID STREP A (OFFICE): Rapid Strep A Screen: NEGATIVE

## 2019-01-25 LAB — POC INFLUENZA A&B (BINAX/QUICKVUE)
Influenza A, POC: NEGATIVE
Influenza B, POC: NEGATIVE

## 2019-01-25 MED ORDER — HYDROXYZINE HCL 10 MG PO TABS: ORAL_TABLET | ORAL | 0 refills | Status: DC

## 2019-01-25 MED ORDER — AMOXICILLIN-POT CLAVULANATE 875-125 MG PO TABS: 1.0000 | ORAL_TABLET | Freq: Two times a day (BID) | ORAL | 0 refills | Status: DC

## 2019-01-25 MED ORDER — CLONAZEPAM 0.5 MG PO TABS: 0.5000 mg | ORAL_TABLET | Freq: Two times a day (BID) | ORAL | 0 refills | Status: DC | PRN

## 2019-01-25 MED ORDER — BENZONATATE 100 MG PO CAPS: 100.0000 mg | ORAL_CAPSULE | Freq: Three times a day (TID) | ORAL | 0 refills | Status: DC | PRN

## 2019-01-25 MED ORDER — FLUTICASONE PROPIONATE 50 MCG/ACT NA SUSP: 2.0000 | Freq: Every day | NASAL | 1 refills | Status: DC

## 2019-01-25 NOTE — Patient Instructions (Addendum)
You do appear to have recent sinusitis type symptoms and pharyngitis symptoms.  Some intermittent cough with mild bronchitis symptoms as well but no wheezing reported.  You report that she did feel improved when you were on 3 days of amoxicillin before you run out of your daughter's leftover medications.  Will prescribe Augmentin antibiotic.  Both your rapid strep and flu test were negative.  For cough, I am prescribing benzonatate.  For nasal congestion, I am prescribing Flonase.  For occasional itching to your left ear/prior surgical site, I am making hydroxyzine available.  Itching tends to occur more at night so hydroxyzine can help you sleep as well as stop itching.  For history of anxiety with very rare use of clonazepam, making 14 tablets of clonazepam available to use as needed.  Happy that you use it very sparingly and 14 tablets lasted you a whole year.  Follow-up in 7 to 10 days or as needed.

## 2019-01-25 NOTE — Progress Notes (Signed)
Subjective:    Patient ID: Valerie Burke, female    DOB: 29-Jan-1976, 43 y.o.   MRN: [id number]  HPI  Pt in states she last week she felt worse than today. Pt states she felt like she has had sinus infection and bronchitis. She states got nasal congestion, sinus pressure followed by productive cough and chest congestion. She states first day before other symptoms achy and tired for one day. Pt states had low grade subjective fever.   She feels better this week compared to last week. This morning woke up with hoarse voice.  Pt states she took left over 2-3 days of amoxicillin from daughter prior illness. She states on those days she felt better progressivley then stopped improving as ran out of meds.   Review of Systems  Constitutional: Negative for chills, fatigue and fever.  HENT: Positive for congestion, sinus pressure, sinus pain, sore throat and trouble swallowing.        Throat is soar and itchy.  Respiratory: Positive for cough. Negative for chest tightness, shortness of breath and wheezing.   Cardiovascular: Negative for chest pain and palpitations.  Gastrointestinal: Negative for abdominal pain.  Musculoskeletal: Negative for back pain.  Skin: Negative for rash.  Neurological: Negative for dizziness, weakness, numbness and headaches.  Hematological: Negative for adenopathy. Does not bruise/bleed easily.  Psychiatric/Behavioral: Negative for behavioral problems, confusion, decreased concentration, sleep disturbance and suicidal ideas. The patient is nervous/anxious.        Recent flare of prior anxiety. She uses clonazepam in the past. Rare refills. Not monthly.    Past Medical History:  Diagnosis Date  . Allergy   . Anemia   . Anxiety   . GERD (gastroesophageal reflux disease)   . OSA (obstructive sleep apnea) 02/03/2017     Social History   Socioeconomic History  . Marital status: Married    Spouse name: Not on file  . Number of children: 3  . Years of education:  Not on file  . Highest education level: Not on file  Occupational History  . Occupation: part time  Social Needs  . Financial resource strain: Not on file  . Food insecurity:    Worry: Not on file    Inability: Not on file  . Transportation needs:    Medical: Not on file    Non-medical: Not on file  Tobacco Use  . Smoking status: Never Smoker  . Smokeless tobacco: Never Used  Substance and Sexual Activity  . Alcohol use: Yes    Comment: 2-3 glasses of wine  . Drug use: No  . Sexual activity: Never    Birth control/protection: I.U.D.  Lifestyle  . Physical activity:    Days per week: Not on file    Minutes per session: Not on file  . Stress: Not on file  Relationships  . Social connections:    Talks on phone: Not on file    Gets together: Not on file    Attends religious service: Not on file    Active member of club or organization: Not on file    Attends meetings of clubs or organizations: Not on file    Relationship status: Not on file  . Intimate partner violence:    Fear of current or ex partner: Not on file    Emotionally abused: Not on file    Physically abused: Not on file    Forced sexual activity: Not on file  Other Topics Concern  . Not on file  Social History Narrative  . Not on file    Past Surgical History:  Procedure Laterality Date  . WISDOM TOOTH EXTRACTION     age 70    Family History  Problem Relation Age of Onset  . Asthma Mother   . Hypertension Father   . Hypertension Paternal Uncle   . Heart disease Maternal Grandmother   . Asthma Brother     Allergies  Allergen Reactions  . Levofloxacin Rash    Current Outpatient Medications on File Prior to Visit  Medication Sig Dispense Refill  . clonazePAM (KLONOPIN) 0.5 MG tablet Take 1 tablet (0.5 mg total) by mouth 2 (two) times daily as needed for anxiety. 14 tablet 0  . levonorgestrel (MIRENA, 52 MG,) 20 MCG/24HR IUD Mirena 20 mcg/24 hours (5 yrs) 52 mg intrauterine device    . PROAIR  HFA 108 (90 Base) MCG/ACT inhaler INHALE 2 PUFFS INTO THE LUNGS EVERY 6 HOURS AS NEEDED FOR WHEEZING OR SHORTNESS OF BREATH 8.5 g 0  . cetirizine (ZYRTEC) 10 MG tablet Take 10 mg by mouth daily.     No current facility-administered medications on file prior to visit.     BP 133/81   Pulse 78   Temp (!) 78 F (25.6 C) (Oral)   Resp 16   Ht 5\' 3"  (1.6 m)   Wt 250 lb 12.8 oz (113.8 kg)   SpO2 99%   BMI 44.43 kg/m       Objective:   Physical Exam  General  Mental Status - Alert. General Appearance - Well groomed. Not in acute distress.  Skin Rashes- No Rashes.  HEENT Head- Normal. Ear Auditory Canal - Left- Normal. Right - Normal.Tympanic Membrane- Left- Normal. Right- Normal. Eye Sclera/Conjunctiva- Left- Normal. Right- Normal. Nose & Sinuses Nasal Mucosa- Left-  Boggy and Congested. Right-  Boggy and  Congested.Bilateral maxillary and frontal sinus pressure. Mouth & Throat Lips: Upper Lip- Normal: no dryness, cracking, pallor, cyanosis, or vesicular eruption. Lower Lip-Normal: no dryness, cracking, pallor, cyanosis or vesicular eruption. Buccal Mucosa- Bilateral- No Aphthous ulcers. Oropharynx- No Discharge or Erythema. Tonsils: Characteristics- Bilateral- Erythema.  Size/Enlargement- Bilateral- No enlargement. Discharge- bilateral-None.  Neck Neck- Supple. No Masses.  Chest and Lung Exam Auscultation: Breath Sounds:-Clear even and unlabored.  Cardiovascular Auscultation:Rythm- Regular, rate and rhythm. Murmurs & Other Heart Sounds:Ausculatation of the heart reveal- No Murmurs.  Lymphatic Head & Neck General Head & Neck Lymphatics: Bilateral: Description- No Localized lymphadenopathy.       Assessment & Plan:  You do appear to have recent sinusitis type symptoms and pharyngitis symptoms.  Some intermittent cough with mild bronchitis symptoms as well but no wheezing reported.  You report that she did feel improved when you were on 3 days of amoxicillin before  you run out of your daughter's leftover medications.  Will prescribe Augmentin antibiotic.  Both your rapid strep and flu test were negative.  For cough, I am prescribing benzonatate.  For nasal congestion, I am prescribing Flonase.  For occasional itching to your left ear/prior surgical site, I am making hydroxyzine available.  Itching tends to occur more at night so hydroxyzine can help you sleep as well as stop itching.  For history of anxiety with very rare use of clonazepam, making 14 tablets of clonazepam available to use as needed.  Happy that you use it very sparingly and 14 tablets lasted you a whole year.  Follow-up in 7 to 10 days or as needed.  Esperanza Richters, PA-C

## 2019-01-30 ENCOUNTER — Encounter: Payer: Self-pay | Admitting: Medical

## 2019-01-30 ENCOUNTER — Ambulatory Visit (HOSPITAL_BASED_OUTPATIENT_CLINIC_OR_DEPARTMENT_OTHER)
Admission: RE | Admit: 2019-01-30 | Discharge: 2019-01-30 | Disposition: A | Discharge status: Home / Self Care | Payer: BLUE CROSS/BLUE SHIELD | Source: Ambulatory Visit | Attending: Medical | Admitting: Medical

## 2019-01-30 ENCOUNTER — Ambulatory Visit (INDEPENDENT_AMBULATORY_CARE_PROVIDER_SITE_OTHER): Payer: BLUE CROSS/BLUE SHIELD | Admitting: Medical

## 2019-01-30 ENCOUNTER — Other Ambulatory Visit: Payer: Self-pay

## 2019-01-30 VITALS — BP 124/78 | HR 95 | Temp 98.4°F | Resp 16 | Ht 63.0 in | Wt 252.2 lb

## 2019-01-30 DIAGNOSIS — J40 Bronchitis, not specified as acute or chronic: Secondary | ICD-10-CM

## 2019-01-30 DIAGNOSIS — R05 Cough: Secondary | ICD-10-CM

## 2019-01-30 DIAGNOSIS — R5383 Other fatigue: Secondary | ICD-10-CM | POA: Diagnosis not present

## 2019-01-30 DIAGNOSIS — R059 Cough, unspecified: Secondary | ICD-10-CM

## 2019-01-30 MED ORDER — CEFTRIAXONE SODIUM 1 G IJ SOLR
1.0000 g | Freq: Once | INTRAMUSCULAR | Status: AC
  Administered 2019-01-30: 1 g via INTRAMUSCULAR

## 2019-01-30 MED ORDER — ALBUTEROL SULFATE HFA 108 (90 BASE) MCG/ACT IN AERS: 2.0000 | INHALATION_SPRAY | Freq: Four times a day (QID) | RESPIRATORY_TRACT | 2 refills | Status: DC | PRN

## 2019-01-30 MED ORDER — HYDROCODONE-HOMATROPINE 5-1.5 MG/5ML PO SYRP: 5.0000 mL | ORAL_SOLUTION | Freq: Four times a day (QID) | ORAL | 0 refills | Status: DC | PRN

## 2019-01-30 MED FILL — HYDROCODONE-HOMATROPINE SYR: 5-1.5 | 5 days supply | Qty: 100 | Fill #0

## 2019-01-30 NOTE — Progress Notes (Signed)
Subjective:    Patient ID: Valerie Burke, female    DOB: September 15, 1976, 43 y.o.   MRN: [id number]  HPI  Pt in for follow up.  Pt states since last visit. She feels very tired. Pt states recent urinary incontinence when she coughs(hx of this in the past). She states will urinate.  Points to her bilateral  lower rib area hurt when coughs.. Pt states she was coughing up mucus yesterday. But today mostly dry cough. After she ate got productive cough. She denies any recent reflux on review.  Pt has not slept since she is coughing a lot. Pt is using benzonatate but sounds like not helping much.  I prescribed augmentin and flonase on last vist. Pt has not had any fevers. No known contact to person with covid and no wheezing/sob.  Both rapid flu and strep were negative.     Review of Systems  Constitutional: Positive for fatigue. Negative for chills and fever.  HENT: Positive for congestion. Negative for postnasal drip, sinus pressure, sneezing and sore throat.   Respiratory: Positive for cough. Negative for choking, chest tightness, shortness of breath and wheezing.   Cardiovascular: Negative for chest pain and palpitations.  Gastrointestinal: Negative for abdominal pain.  Genitourinary: Negative for dyspareunia, enuresis, flank pain, frequency, urgency and vaginal pain.       Some urinary incontinence on coughing agressivley. She gave birth to 3 kids in past.  Musculoskeletal: Negative for back pain.  Neurological: Negative for dizziness, weakness and numbness.  Hematological: Negative for adenopathy.  Psychiatric/Behavioral: Negative for behavioral problems and confusion.   Past Medical History:  Diagnosis Date  . Allergy   . Anemia   . Anxiety   . GERD (gastroesophageal reflux disease)   . OSA (obstructive sleep apnea) 02/03/2017     Social History   Socioeconomic History  . Marital status: Married    Spouse name: Not on file  . Number of children: 3  . Years of education:  Not on file  . Highest education level: Not on file  Occupational History  . Occupation: part time  Social Needs  . Financial resource strain: Not on file  . Food insecurity:    Worry: Not on file    Inability: Not on file  . Transportation needs:    Medical: Not on file    Non-medical: Not on file  Tobacco Use  . Smoking status: Never Smoker  . Smokeless tobacco: Never Used  Substance and Sexual Activity  . Alcohol use: Yes    Comment: 2-3 glasses of wine  . Drug use: No  . Sexual activity: Never    Birth control/protection: I.U.D.  Lifestyle  . Physical activity:    Days per week: Not on file    Minutes per session: Not on file  . Stress: Not on file  Relationships  . Social connections:    Talks on phone: Not on file    Gets together: Not on file    Attends religious service: Not on file    Active member of club or organization: Not on file    Attends meetings of clubs or organizations: Not on file    Relationship status: Not on file  . Intimate partner violence:    Fear of current or ex partner: Not on file    Emotionally abused: Not on file    Physically abused: Not on file    Forced sexual activity: Not on file  Other Topics Concern  . Not on  file  Social History Narrative  . Not on file    Past Surgical History:  Procedure Laterality Date  . WISDOM TOOTH EXTRACTION     age 56    Family History  Problem Relation Age of Onset  . Asthma Mother   . Hypertension Father   . Hypertension Paternal Uncle   . Heart disease Maternal Grandmother   . Asthma Brother     Allergies  Allergen Reactions  . Levofloxacin Rash    Current Outpatient Medications on File Prior to Visit  Medication Sig Dispense Refill  . amoxicillin-clavulanate (AUGMENTIN) 875-125 MG tablet Take 1 tablet by mouth 2 (two) times daily. 20 tablet 0  . benzonatate (TESSALON) 100 MG capsule Take 1 capsule (100 mg total) by mouth 3 (three) times daily as needed for cough. 30 capsule 0  .  cetirizine (ZYRTEC) 10 MG tablet Take 10 mg by mouth daily.    . clonazePAM (KLONOPIN) 0.5 MG tablet Take 1 tablet (0.5 mg total) by mouth 2 (two) times daily as needed for anxiety. 14 tablet 0  . fluticasone (FLONASE) 50 MCG/ACT nasal spray Place 2 sprays into both nostrils daily. 16 g 1  . hydrOXYzine (ATARAX/VISTARIL) 10 MG tablet 1 to 2 tablets at night prior to sleep for insomnia or itching. 30 tablet 0  . levonorgestrel (MIRENA, 52 MG,) 20 MCG/24HR IUD Mirena 20 mcg/24 hours (5 yrs) 52 mg intrauterine device    . PROAIR HFA 108 (90 Base) MCG/ACT inhaler INHALE 2 PUFFS INTO THE LUNGS EVERY 6 HOURS AS NEEDED FOR WHEEZING OR SHORTNESS OF BREATH 8.5 g 0   No current facility-administered medications on file prior to visit.     BP 124/78   Pulse 95   Temp 98.4 F (36.9 C) (Oral)   Resp 16   Ht 5\' 3"  (1.6 m)   Wt 252 lb 3.2 oz (114.4 kg)   SpO2 98%   BMI 44.68 kg/m       Objective:   Physical Exam  General  Mental Status - Alert. General Appearance - Well groomed. Not in acute distress.  Skin Rashes- No Rashes.  HEENT Head- Normal. Ear Auditory Canal - Left- Normal. Right - Normal.Tympanic Membrane- Left- Normal. Right- Normal. Eye Sclera/Conjunctiva- Left- Normal. Right- Normal. Nose & Sinuses Nasal Mucosa- Left-  Boggy and Congested. Right-  Boggy and  Congested.Bilateral no  maxillary and  No frontal sinus pressure. Mouth & Throat Lips: Upper Lip- Normal: no dryness, cracking, pallor, cyanosis, or vesicular eruption. Lower Lip-Normal: no dryness, cracking, pallor, cyanosis or vesicular eruption. Buccal Mucosa- Bilateral- No Aphthous ulcers. Oropharynx- No Discharge or Erythema. Tonsils: Characteristics- Bilateral- No Erythema or Congestion. Size/Enlargement- Bilateral- No enlargement. Discharge- bilateral-None.  Neck Neck- Supple. No Masses.   Chest and Lung Exam Auscultation: Breath Sounds:-Clear even and unlabored.  Cardiovascular Auscultation:Rythm-  Regular, rate and rhythm. Murmurs & Other Heart Sounds:Ausculatation of the heart reveal- No Murmurs.  Lymphatic Head & Neck General Head & Neck Lymphatics: Bilateral: Description- No Localized lymphadenopathy.       Assessment & Plan:  You have mostly persisting bronchitis type symptoms.  Since symptoms are persisting and you have productive cough, we will get CBC lab today and a chest x-ray.  Will rule out walking pneumonia.  You do report some significant fatigue recently but also he had not slept well since the cough is keeping you up at night.  I want you to stop benzonatate cough medicine and making Hycodan cough medication available.  Rx advisement  given regarding Hycodan.  Presently continue Augmentin and will follow chest x-ray and lab results.  Might switch you to different antibiotic such as doxycycline.  During the interim while we wait for the results of studies we gave you 1 g Rocephin IM in the office.  With recent viral pandemic, I want you to check your temperature daily over the next 7 to 10 days.  Recommend not using any Tylenol or ibuprofen since second mask fever.  If you get a fever over of 100.4 or higher then please let me know.  In that event I would write an order for you to go to the drive-through testing center to get the covid test done.  Follow-up in 7 to 10 days or as needed.  Esperanza Richters, PA-C

## 2019-01-30 NOTE — Patient Instructions (Signed)
You have mostly persisting bronchitis type symptoms.  Since symptoms are persisting and you have productive cough, we will get CBC lab today and a chest x-ray.  Will rule out walking pneumonia.  You do report some significant fatigue recently but also he had not slept well since the cough is keeping you up at night.  I want you to stop benzonatate cough medicine and making Hycodan cough medication available.  Rx advisement given regarding Hycodan.  Presently continue Augmentin and will follow chest x-ray and lab results.  Might switch you to different antibiotic such as doxycycline.  During the interim while we wait for the results of studies we gave you 1 g Rocephin IM in the office.  With recent viral pandemic, I want you to check your temperature daily over the next 7 to 10 days.  Recommend not using any Tylenol or ibuprofen since second mask fever.  If you get a fever over of 100.4 or higher then please let me know.  In that event I would write an order for you to go to the drive-through testing center to get the covid test done.  Follow-up in 7 to 10 days or as needed.

## 2019-01-31 ENCOUNTER — Telehealth: Payer: Self-pay | Admitting: Medical

## 2019-01-31 ENCOUNTER — Ambulatory Visit: Payer: Self-pay

## 2019-01-31 LAB — CBC WITH DIFFERENTIAL/PLATELET
BASOS PCT: 0.9 % (ref 0.0–3.0)
Basophils Absolute: 0.1 10*3/uL (ref 0.0–0.1)
Eosinophils Absolute: 0.3 10*3/uL (ref 0.0–0.7)
Eosinophils Relative: 2.7 % (ref 0.0–5.0)
HCT: 37.7 % (ref 36.0–46.0)
Hemoglobin: 12.8 g/dL (ref 12.0–15.0)
Lymphocytes Relative: 19.8 % (ref 12.0–46.0)
Lymphs Abs: 2.3 10*3/uL (ref 0.7–4.0)
MCHC: 33.8 g/dL (ref 30.0–36.0)
MCV: 88.4 fl (ref 78.0–100.0)
Monocytes Absolute: 1.4 10*3/uL — ABNORMAL HIGH (ref 0.1–1.0)
Monocytes Relative: 11.4 % (ref 3.0–12.0)
Neutro Abs: 7.7 10*3/uL (ref 1.4–7.7)
Neutrophils Relative %: 65.2 % (ref 43.0–77.0)
Platelets: 318 10*3/uL (ref 150.0–400.0)
RBC: 4.26 Mil/uL (ref 3.87–5.11)
RDW: 13.8 % (ref 11.5–15.5)
WBC: 11.9 10*3/uL — ABNORMAL HIGH (ref 4.0–10.5)

## 2019-01-31 MED ORDER — FLUCONAZOLE 150 MG PO TABS: 150.0000 mg | ORAL_TABLET | Freq: Once | ORAL | 1 refills | Status: AC

## 2019-01-31 NOTE — Telephone Encounter (Signed)
Called informed the patient prescription sent in.

## 2019-01-31 NOTE — Telephone Encounter (Signed)
Rx diflucan sent to pt pharmacy. 

## 2019-01-31 NOTE — Telephone Encounter (Signed)
  Pt c/o vaginal itching early this morning. Pt stated he recognized the symptoms as the beginning of a yeast infection. Pt stated that she has just finished a round of abx. No discharge from vagina, no rash, no pain, or bleeding.  Pt requesting Diflucan to be called to her pharmacy. Pt stated she used it before for this symptom and got relief. Pt did not want to make an appt due to fear of catching an illness. Home care advice given and pt verbalized understanding. Reason for Disposition . [1] Symptoms of a yeast infection (i.e., itchy, white discharge, not bad smelling) AND    [2] feels like prior vaginal yeast infections  Answer Assessment - Initial Assessment Questions 1. SYMPTOM: "What's the main symptom you're concerned about?" (e.g., pain, itching, dryness)     itching 2. LOCATION: "Where is the  itching located?" (e.g., inside/outside, left/right)  vaginal region 3. ONSET: "When did the  itching  start?"     Early this am 4. PAIN: "Is there any pain?" If so, ask: "How bad is it?" (Scale: 1-10; mild, moderate, severe) no 5. ITCHING: "Is there any itching?" If so, ask: "How bad is it?" (Scale: 1-10; mild, moderate, severe) Yes-mild 6. CAUSE: "What do you think is causing the discharge?" "Have you had the same problem before? What happened then?" Abx-yes-diflucan 7. OTHER SYMPTOMS: "Do you have any other symptoms?" (e.g., fever, itching, vaginal bleeding, pain with urination, injury to genital area, vaginal foreign body)     itching 8. PREGNANCY: "Is there any chance you are pregnant?" "When was your last menstrual period?"    N/a IUD  Protocols used: V[facility]INAL [facility]

## 2019-02-01 ENCOUNTER — Other Ambulatory Visit: Payer: Self-pay | Admitting: Medical

## 2019-02-11 DIAGNOSIS — N762 Acute vulvitis: Secondary | ICD-10-CM | POA: Diagnosis not present

## 2019-02-11 DIAGNOSIS — R309 Painful micturition, unspecified: Secondary | ICD-10-CM | POA: Diagnosis not present

## 2019-02-11 DIAGNOSIS — Z6841 Body Mass Index (BMI) 40.0 and over, adult: Secondary | ICD-10-CM | POA: Diagnosis not present

## 2019-04-19 ENCOUNTER — Telehealth: Payer: Self-pay

## 2019-04-19 NOTE — Telephone Encounter (Signed)
Pt scheduled  04/22/19

## 2019-04-19 NOTE — Telephone Encounter (Signed)
Copied from CRM [id number]. Topic: General - Other >> Apr 18, 2019  9:04 AM Herby Abraham C wrote: Pt called in to request a Rx for Ozempic. Pt says that she is trying to lose weight.   Pharmacy: [id number] - [place], [place] - [address] [phone] (Phone) [phone] (Fax)

## 2019-04-19 NOTE — Telephone Encounter (Signed)
I reviewed her chart and she is not diabetic. It is true that some diabetic do loose weight with this. But it is not weight loss medication just has secondary benefit for diabetic patients. Also it is pretty strong so if someone is just minimally diabetic med would likley cause too low sugar.  She has had one slight elevated blood sugar in past in 2017. I want her to schedule virtual visit. Discuss options on weight loss. Then will also order a1c and metablolic panel. If her sugars were moderate high on average might consider ozempic. I think sugar average will likley fall in prediabetic range. Metformin option for prediabetic and does not cause too low sugars. Also can help with weight loss. But she needs appointment.

## 2019-04-22 ENCOUNTER — Ambulatory Visit (INDEPENDENT_AMBULATORY_CARE_PROVIDER_SITE_OTHER): Payer: BC Managed Care – PPO | Admitting: Medical

## 2019-04-22 ENCOUNTER — Encounter: Payer: Self-pay | Admitting: Medical

## 2019-04-22 ENCOUNTER — Other Ambulatory Visit: Payer: Self-pay

## 2019-04-22 VITALS — BP 128/80 | Ht 63.0 in | Wt 252.0 lb

## 2019-04-22 DIAGNOSIS — R5383 Other fatigue: Secondary | ICD-10-CM

## 2019-04-22 DIAGNOSIS — R739 Hyperglycemia, unspecified: Secondary | ICD-10-CM | POA: Diagnosis not present

## 2019-04-22 DIAGNOSIS — E669 Obesity, unspecified: Secondary | ICD-10-CM

## 2019-04-22 DIAGNOSIS — Z0000 Encounter for general adult medical examination without abnormal findings: Secondary | ICD-10-CM

## 2019-04-22 DIAGNOSIS — Z0001 Encounter for general adult medical examination with abnormal findings: Secondary | ICD-10-CM | POA: Diagnosis not present

## 2019-04-22 NOTE — Patient Instructions (Addendum)
For you wellness exam today I have ordered cbc, cmp and , lipid panel   For fatigue will order tsh, b12, and b1,  For mild elevated sugar in past will get a1c.  Vaccine up to date.  Recommend exercise and healthy diet.  We will let you know lab results as they come in.  For obesity and weight loss recommend weight watchers and may rx metformin when labs back.  Follow up date appointment will be determined after lab review.    Preventive Care 18-39 Years, Female Preventive care refers to lifestyle choices and visits with your health care provider that can promote health and wellness. What does preventive care include?   A yearly physical exam. This is also called an annual well check.  Dental exams once or twice a year.  Routine eye exams. Ask your health care provider how often you should have your eyes checked.  Personal lifestyle choices, including: ? Daily care of your teeth and gums. ? Regular physical activity. ? Eating a healthy diet. ? Avoiding tobacco and drug use. ? Limiting alcohol use. ? Practicing safe sex. ? Taking vitamin and mineral supplements as recommended by your health care provider. What happens during an annual well check? The services and screenings done by your health care provider during your annual well check will depend on your age, overall health, lifestyle risk factors, and family history of disease. Counseling Your health care provider may ask you questions about your:  Alcohol use.  Tobacco use.  Drug use.  Emotional well-being.  Home and relationship well-being.  Sexual activity.  Eating habits.  Work and work Statistician.  Method of birth control.  Menstrual cycle.  Pregnancy history. Screening You may have the following tests or measurements:  Height, weight, and BMI.  Diabetes screening. This is done by checking your blood sugar (glucose) after you have not eaten for a while (fasting).  Blood pressure.  Lipid  and cholesterol levels. These may be checked every 5 years starting at age 87.  Skin check.  Hepatitis C blood test.  Hepatitis B blood test.  Sexually transmitted disease (STD) testing.  BRCA-related cancer screening. This may be done if you have a family history of breast, ovarian, tubal, or peritoneal cancers.  Pelvic exam and Pap test. This may be done every 3 years starting at age 13. Starting at age 59, this may be done every 5 years if you have a Pap test in combination with an HPV test. Discuss your test results, treatment options, and if necessary, the need for more tests with your health care provider. Vaccines Your health care provider may recommend certain vaccines, such as:  Influenza vaccine. This is recommended every year.  Tetanus, diphtheria, and acellular pertussis (Tdap, Td) vaccine. You may need a Td booster every 10 years.  Varicella vaccine. You may need this if you have not been vaccinated.  HPV vaccine. If you are 86 or younger, you may need three doses over 6 months.  Measles, mumps, and rubella (MMR) vaccine. You may need at least one dose of MMR. You may also need a second dose.  Pneumococcal 13-valent conjugate (PCV13) vaccine. You may need this if you have certain conditions and were not previously vaccinated.  Pneumococcal polysaccharide (PPSV23) vaccine. You may need one or two doses if you smoke cigarettes or if you have certain conditions.  Meningococcal vaccine. One dose is recommended if you are age 34-21 years and a Market researcher living in a residence hall,  or if you have one of several medical conditions. You may also need additional booster doses.  Hepatitis A vaccine. You may need this if you have certain conditions or if you travel or work in places where you may be exposed to hepatitis A.  Hepatitis B vaccine. You may need this if you have certain conditions or if you travel or work in places where you may be exposed to  hepatitis B.  Haemophilus influenzae type b (Hib) vaccine. You may need this if you have certain risk factors. Talk to your health care provider about which screenings and vaccines you need and how often you need them. This information is not intended to replace advice given to you by your health care provider. Make sure you discuss any questions you have with your health care provider. Document Released: 12/27/2001 Document Revised: 06/13/2017 Document Reviewed: 09/01/2015 Elsevier Interactive Patient Education  2019 [employer].

## 2019-04-22 NOTE — Progress Notes (Signed)
   Subjective:    Patient ID: Valerie Burke, female    DOB: 19-Apr-1976, 43 y.o.   MRN: [id number]  HPI  Virtual Visit via Video Note  I connected with Valerie Burke on 04/22/19 at  2:00 PM EDT by a video enabled telemedicine application and verified that I am speaking with the correct person using two identifiers.  Location: Patient: home Provider: office   I discussed the limitations of evaluation and management by telemedicine and the availability of in person appointments. The patient expressed understanding and agreed to proceed.   History of Present Illness:   Pt will do cpe.  Pt up to date on her vaccines per epic. I encouraged her to get flu vaccine this year.  Pt not exercising due to knee pain. She states moderate unhealthy diet.  Drinks 3-4 sodas a day. Non smoker.Drinks maybe once a month.    Pt wants to loose weight. Her weight has consistently been in mid 250 lb range. She stats hard for her to exercise due to chronic knee pain left knee. Pt states twice dislocated her knee cap in remote past. She was told by ortho she needs to have surgery. This is one barrier to her weight loss.  Pt has tried weight watchers     Observations/Objective:  General-no acute distress, pleasant, oriented. Lungs- on inspection lungs appear unlabored. Neck- no tracheal deviation or jvd on inspection. Neuro- gross motor function appears intact. Lower ext- no swelling. Symmetric.    Assessment and Plan: For you wellness exam today I have ordered cbc, cmp and , lipid panel   For fatigue will order tsh, b12, and b1,  For mild elevated sugar in past will get a1c.  Vaccine up to date.  Recommend exercise and healthy diet.  We will let you know lab results as they come in.  For obesity and weight loss recommend weight watchers and may rx metformin when labs back.  Follow up date appointment will be determined after lab review.  Follow Up Instructions:    I discussed  the assessment and treatment plan with the patient. The patient was provided an opportunity to ask questions and all were answered. The patient agreed with the plan and demonstrated an understanding of the instructions.   The patient was advised to call back or seek an in-person evaluation if the symptoms worsen or if the condition fails to improve as anticipated.  Wellness exam done. Also addressed fatigue, high sugar and obesity. Chart 99213 in addtion to CPE.   Mackie Pai, PA-C   Review of Systems  Constitutional: Positive for fatigue. Negative for chills and fever.  HENT: Negative for congestion and ear pain.   Respiratory: Negative for cough, chest tightness, shortness of breath and wheezing.   Cardiovascular: Negative for chest pain.  Gastrointestinal: Negative for abdominal distention and anal bleeding.  Endocrine: Negative for polydipsia, polyphagia and polyuria.  Musculoskeletal: Negative for back pain and neck stiffness.  Skin: Negative for rash.  Neurological: Negative for dizziness, seizures, facial asymmetry, speech difficulty and weakness.  Hematological: Negative for adenopathy. Does not bruise/bleed easily.  Psychiatric/Behavioral: Negative for behavioral problems, decreased concentration and suicidal ideas. The patient is not nervous/anxious.        Objective:   Physical Exam        Assessment & Plan:

## 2019-04-29 ENCOUNTER — Other Ambulatory Visit: Payer: Self-pay

## 2019-04-29 ENCOUNTER — Other Ambulatory Visit (INDEPENDENT_AMBULATORY_CARE_PROVIDER_SITE_OTHER): Payer: BC Managed Care – PPO

## 2019-04-29 DIAGNOSIS — Z0000 Encounter for general adult medical examination without abnormal findings: Secondary | ICD-10-CM | POA: Diagnosis not present

## 2019-04-29 DIAGNOSIS — R5383 Other fatigue: Secondary | ICD-10-CM | POA: Diagnosis not present

## 2019-04-29 DIAGNOSIS — R739 Hyperglycemia, unspecified: Secondary | ICD-10-CM

## 2019-04-30 ENCOUNTER — Telehealth: Payer: Self-pay | Admitting: Medical

## 2019-04-30 LAB — COMPREHENSIVE METABOLIC PANEL
ALT: 50 U/L — ABNORMAL HIGH (ref 0–35)
AST: 32 U/L (ref 0–37)
Albumin: 4 g/dL (ref 3.5–5.2)
Alkaline Phosphatase: 78 U/L (ref 39–117)
BUN: 12 mg/dL (ref 6–23)
CO2: 26 mEq/L (ref 19–32)
Calcium: 9 mg/dL (ref 8.4–10.5)
Chloride: 103 mEq/L (ref 96–112)
Creatinine, Ser: 0.63 mg/dL (ref 0.40–1.20)
GFR: 102.72 mL/min (ref >60.00)
Glucose, Bld: 89 mg/dL (ref 70–99)
Potassium: 4.1 mEq/L (ref 3.5–5.1)
Sodium: 137 mEq/L (ref 135–145)
Total Bilirubin: 0.4 mg/dL (ref 0.2–1.2)
Total Protein: 6.6 g/dL (ref 6.0–8.3)

## 2019-04-30 LAB — LIPID PANEL
Cholesterol: 158 mg/dL (ref 0–200)
HDL: 41.2 mg/dL (ref >39.00)
LDL Cholesterol: 92 mg/dL (ref 0–99)
NonHDL: 117.16
Total CHOL/HDL Ratio: 4
Triglycerides: 128 mg/dL (ref 0.0–149.0)
VLDL: 25.6 mg/dL (ref 0.0–40.0)

## 2019-04-30 LAB — TSH: TSH: 1.62 u[IU]/mL (ref 0.35–4.50)

## 2019-04-30 LAB — CBC WITH DIFFERENTIAL/PLATELET
Basophils Absolute: 0.1 10*3/uL (ref 0.0–0.1)
Basophils Relative: 0.6 % (ref 0.0–3.0)
Eosinophils Absolute: 0.4 10*3/uL (ref 0.0–0.7)
Eosinophils Relative: 3.2 % (ref 0.0–5.0)
HCT: 36.2 % (ref 36.0–46.0)
Hemoglobin: 12.1 g/dL (ref 12.0–15.0)
Lymphocytes Relative: 24.9 % (ref 12.0–46.0)
Lymphs Abs: 3.3 10*3/uL (ref 0.7–4.0)
MCHC: 33.4 g/dL (ref 30.0–36.0)
MCV: 89.1 fl (ref 78.0–100.0)
Monocytes Absolute: 0.9 10*3/uL (ref 0.1–1.0)
Monocytes Relative: 6.5 % (ref 3.0–12.0)
Neutro Abs: 8.7 10*3/uL — ABNORMAL HIGH (ref 1.4–7.7)
Neutrophils Relative %: 64.8 % (ref 43.0–77.0)
Platelets: 365 10*3/uL (ref 150.0–400.0)
RBC: 4.06 Mil/uL (ref 3.87–5.11)
RDW: 13.3 % (ref 11.5–15.5)
WBC: 13.4 10*3/uL — ABNORMAL HIGH (ref 4.0–10.5)

## 2019-04-30 LAB — T4, FREE: Free T4: 0.9 ng/dL (ref 0.60–1.60)

## 2019-04-30 LAB — HEMOGLOBIN A1C: Hgb A1c MFr Bld: 5.6 % (ref 4.6–6.5)

## 2019-04-30 MED ORDER — METFORMIN HCL 500 MG PO TABS: 500.0000 mg | ORAL_TABLET | Freq: Two times a day (BID) | ORAL | 1 refills | Status: DC

## 2019-04-30 NOTE — Telephone Encounter (Signed)
Rx metformin sent to pt pharmacy. 

## 2019-05-23 ENCOUNTER — Other Ambulatory Visit: Payer: Self-pay | Admitting: Medical

## 2019-06-23 ENCOUNTER — Other Ambulatory Visit: Payer: Self-pay | Admitting: Medical

## 2019-10-14 DIAGNOSIS — L814 Other melanin hyperpigmentation: Secondary | ICD-10-CM | POA: Diagnosis not present

## 2019-10-14 DIAGNOSIS — L910 Hypertrophic scar: Secondary | ICD-10-CM | POA: Diagnosis not present

## 2019-10-14 DIAGNOSIS — L818 Other specified disorders of pigmentation: Secondary | ICD-10-CM | POA: Diagnosis not present

## 2019-10-16 ENCOUNTER — Ambulatory Visit (INDEPENDENT_AMBULATORY_CARE_PROVIDER_SITE_OTHER): Payer: BC Managed Care – PPO | Admitting: Medical

## 2019-10-16 ENCOUNTER — Ambulatory Visit (HOSPITAL_BASED_OUTPATIENT_CLINIC_OR_DEPARTMENT_OTHER)
Admission: RE | Admit: 2019-10-16 | Discharge: 2019-10-16 | Disposition: A | Discharge status: Home / Self Care | Payer: BC Managed Care – PPO | Source: Ambulatory Visit | Attending: Medical | Admitting: Medical

## 2019-10-16 ENCOUNTER — Other Ambulatory Visit: Payer: Self-pay

## 2019-10-16 ENCOUNTER — Encounter: Payer: Self-pay | Admitting: Medical

## 2019-10-16 VITALS — BP 133/74 | HR 83 | Temp 97.8°F | Resp 16 | Ht 63.0 in | Wt 258.4 lb

## 2019-10-16 DIAGNOSIS — M25511 Pain in right shoulder: Secondary | ICD-10-CM | POA: Diagnosis not present

## 2019-10-16 MED ORDER — DICLOFENAC SODIUM 75 MG PO TBEC: 75.0000 mg | DELAYED_RELEASE_TABLET | Freq: Two times a day (BID) | ORAL | 0 refills | Status: DC

## 2019-10-16 MED ORDER — TRAMADOL HCL 50 MG PO TABS: 50.0000 mg | ORAL_TABLET | Freq: Four times a day (QID) | ORAL | 0 refills | Status: DC | PRN

## 2019-10-16 NOTE — Patient Instructions (Addendum)
For rt shoulder pain, I did place xray order. Rx diclofenac nsaid and tramadol pain medication. If xray negative then start range of motion exercises as tolerated  to avoid frozen shoulder.  If pain not significantly improved by early next week then will refer you to sports med.  Did also go ahead and refer you to weight management clinic.  Follow up date by my chart or phone update early next week.

## 2019-10-16 NOTE — Progress Notes (Signed)
Subjective:    Patient ID: Elon Spanner, female    DOB: 1976-01-13, 43 y.o.   MRN: [id number]  HPI   Pt in for rt shoulder pain. She slipped in front of store. She fell with hand holding on to the door. She describes pulling her shoulder agressivley. Since then has constant moderate pain. With certain movements has severe pain.  Pt updates me that she has seen ENT for repeat biopsy done by dermatologist. Biopsy came back negative but her left ear still hurts.  Pt has taken ibuprofen but not helping.  Alternating tylenol but still.  Pain for about one week in shoulder. Pain per pt has gotten better just minimally.    Review of Systems  Constitutional: Negative for chills, fatigue and fever.  Respiratory: Negative for cough, chest tightness, shortness of breath and wheezing.   Cardiovascular: Negative for chest pain and palpitations.  Gastrointestinal: Negative for abdominal pain.  Genitourinary: Negative for dyspareunia, dysuria, flank pain and urgency.  Musculoskeletal: Negative for back pain.       Shoulder pain.  Neurological: Negative for dizziness, light-headedness and headaches.  Hematological: Negative for adenopathy. Does not bruise/bleed easily.  Psychiatric/Behavioral: Negative for behavioral problems and confusion.    Past Medical History:  Diagnosis Date  . Allergy   . Anemia   . Anxiety   . GERD (gastroesophageal reflux disease)   . OSA (obstructive sleep apnea) 02/03/2017     Social History   Socioeconomic History  . Marital status: Married    Spouse name: Not on file  . Number of children: 3  . Years of education: Not on file  . Highest education level: Not on file  Occupational History  . Occupation: part time  Social Needs  . Financial resource strain: Not on file  . Food insecurity    Worry: Not on file    Inability: Not on file  . Transportation needs    Medical: Not on file    Non-medical: Not on file  Tobacco Use  . Smoking status:  Never Smoker  . Smokeless tobacco: Never Used  Substance and Sexual Activity  . Alcohol use: Yes    Comment: 2-3 glasses of wine  . Drug use: No  . Sexual activity: Never    Birth control/protection: I.U.D.  Lifestyle  . Physical activity    Days per week: Not on file    Minutes per session: Not on file  . Stress: Not on file  Relationships  . Social Herbalist on phone: Not on file    Gets together: Not on file    Attends religious service: Not on file    Active member of club or organization: Not on file    Attends meetings of clubs or organizations: Not on file    Relationship status: Not on file  . Intimate partner violence    Fear of current or ex partner: Not on file    Emotionally abused: Not on file    Physically abused: Not on file    Forced sexual activity: Not on file  Other Topics Concern  . Not on file  Social History Narrative  . Not on file    Past Surgical History:  Procedure Laterality Date  . WISDOM TOOTH EXTRACTION     age 64    Family History  Problem Relation Age of Onset  . Asthma Mother   . Hypertension Father   . Hypertension Paternal Uncle   . Heart disease  Maternal Grandmother   . Asthma Brother     Allergies  Allergen Reactions  . Levofloxacin Rash    Current Outpatient Medications on File Prior to Visit  Medication Sig Dispense Refill  . albuterol (PROVENTIL HFA;VENTOLIN HFA) 108 (90 Base) MCG/ACT inhaler Inhale 2 puffs into the lungs every 6 (six) hours as needed for wheezing or shortness of breath. 1 Inhaler 2  . cetirizine (ZYRTEC) 10 MG tablet Take 10 mg by mouth daily.    . clonazePAM (KLONOPIN) 0.5 MG tablet Take 1 tablet (0.5 mg total) by mouth 2 (two) times daily as needed for anxiety. 14 tablet 0  . hydrOXYzine (ATARAX/VISTARIL) 10 MG tablet 1 to 2 tablets at night prior to sleep for insomnia or itching. 30 tablet 0  . levonorgestrel (MIRENA, 52 MG,) 20 MCG/24HR IUD Mirena 20 mcg/24 hours (5 yrs) 52 mg  intrauterine device    . PROAIR HFA 108 (90 Base) MCG/ACT inhaler INHALE 2 PUFFS INTO THE LUNGS EVERY 6 HOURS AS NEEDED FOR WHEEZING OR SHORTNESS OF BREATH 8.5 g 0   No current facility-administered medications on file prior to visit.     BP 133/74   Pulse 83   Temp 97.8 F (36.6 C) (Temporal)   Resp 16   Ht 5\' 3"  (1.6 m)   Wt 258 lb 6.4 oz (117.2 kg)   SpO2 99%   BMI 45.77 kg/m       Objective:   Physical Exam  General Mental Status- Alert. General Appearance- Not in acute distress.   Skin General: Color- Normal Color. Moisture- Normal Moisture.  Neck Carotid Arteries- Normal color. Moisture- Normal Moisture. No carotid bruits. No JVD.  Chest and Lung Exam Auscultation: Breath Sounds:-Normal.  Cardiovascular Auscultation:Rythm- Regular. Murmurs & Other Heart Sounds:Auscultation of the heart reveals- No Murmurs.   Neurologic Cranial Nerve exam:- CN III-XII intact(No nystagmus), symmetric smile. Strength:- 5/5 equal and symmetric strength both upper and lower extremities.  Rt shoulder- can abduct upper ext.(note getting better abduction) distal clavicle/shoulder junction tender to palpation.         Assessment & Plan:  For rt shoulder pain, I did place xray order. Rx diclofenac nsaid and tramadol pain medication. If xray negative then start range of motion exercises as tolerated  to avoid frozen shoulder.  If pain not significantly improved by early next week then will refer you to sports med.  Did also go ahead and refer you to weight management clinic.  Follow up date by my chart or phone update early next week.  25 minutes spent with pt. 50% of time spent counseling pt on plan going forward.  , PA-C

## 2019-10-18 ENCOUNTER — Ambulatory Visit: Payer: Self-pay

## 2019-10-18 DIAGNOSIS — M25519 Pain in unspecified shoulder: Secondary | ICD-10-CM

## 2019-10-18 NOTE — Telephone Encounter (Signed)
Returned call to patient.  Reported she took one dose of Tramadol on Wednesday night about 8:00-9:00 PM.  Then, took dose of Tramadol at 8:30 AM, on Thursday morning.  Reported 30-45 min. after taking 2nd dose, she felt tingly in feet/ legs, extreme nausea, headache, and had overall feeling of body temp. Change, from hot to cold.  Stated she went to bed; stayed in bed all day and about 5:30 PM, she vomited.  Stated she started to feel better after vomiting.  Has not had any reoccurence of the above symptoms today.  Stated she does not want to take any more Tramadol.  Stated she is afraid to take any strong pain medication, now.  Is taking the Diclofenac, as prescribed, and getting some relief.  Stated she will call on Monday, if should pain continues / worsens.  Stated she would like to be referred to Dr. Alfredia Client, Orthopedist, for her shoulder pain.  Advised pt. Will send note to [employer].   Agreed.      Reason for Disposition . [1] Caller has NON-URGENT medication question about med that PCP prescribed AND [2] triager unable to answer question  Answer Assessment - Initial Assessment Questions 1.   NAME of MEDICATION: "What medicine are you calling about?"     Tramadol 2.   QUESTION: "What is your question?"     Questions if she had an allergic reaction  3.   PRESCRIBING HCP: "Who prescribed it?" Reason: if prescribed by specialist, call should be referred to that group.     PCP 4. SYMPTOMS: "Do you have any symptoms?"     Tingling sensation in feet and legs, nausea, vomited x 1, headache, and felt she went from hot to cold  5. SEVERITY: If symptoms are present, ask "Are they mild, moderate or severe?"     Felt her symptoms were severe 6.  PREGNANCY:  "Is there any chance that you are pregnant?" "When was your last menstrual period?"     LMP/ has an IUD  Protocols used: MEDICATION QUESTION CALL-A-AH  Message from Esaw Dace sent at 10/18/2019 3:24 PM EST  Summary: Med  Management   Pt stated she took traMADol (ULTRAM) 50 MG tablet yesterday. After her 2nd dose she had tingling, nausea, headache, went from hot to cold. Currently she is feeling better but she thinks she may have had an allergic reaction but she's not sure. She would like to talk with a nurse. She threw up and felt better

## 2019-10-21 NOTE — Addendum Note (Signed)
Addended by: Kem Boroughs D on: 10/21/2019 10:37 AM   Modules accepted: Orders

## 2019-10-21 NOTE — Addendum Note (Signed)
Addended by: Anabel Halon on: 10/21/2019 08:54 AM   Modules accepted: Orders

## 2019-10-21 NOTE — Telephone Encounter (Signed)
Referral to orthopedist placed. 

## 2019-10-21 NOTE — Telephone Encounter (Signed)
Would you add tramadol to pt allegy list.

## 2019-10-21 NOTE — Telephone Encounter (Signed)
Noted in allergy list and patient notified that referral has been placed.

## 2019-10-28 ENCOUNTER — Telehealth: Payer: Self-pay | Admitting: Medical

## 2019-10-28 NOTE — Telephone Encounter (Signed)
Copied from [place] [id number]. Topic: General - Other >> Oct 28, 2019  3:38 PM Keene Breath wrote: Reason for CRM: Patient called to inform the doctor or nurse that the specialist she was referred to is requesting a CD of her x-ray.  Please advise and call to discuss at [phone]

## 2019-10-28 NOTE — Telephone Encounter (Signed)
Patient notified that she will need to call imaging to get copy.  Number given to patient for imaging [phone]

## 2019-10-29 DIAGNOSIS — M7541 Impingement syndrome of right shoulder: Secondary | ICD-10-CM | POA: Diagnosis not present

## 2019-11-21 DIAGNOSIS — Z03818 Encounter for observation for suspected exposure to other biological agents ruled out: Secondary | ICD-10-CM | POA: Diagnosis not present

## 2019-11-21 DIAGNOSIS — J45909 Unspecified asthma, uncomplicated: Secondary | ICD-10-CM | POA: Diagnosis not present

## 2019-11-21 DIAGNOSIS — Z20828 Contact with and (suspected) exposure to other viral communicable diseases: Secondary | ICD-10-CM | POA: Diagnosis not present

## 2019-11-25 ENCOUNTER — Encounter: Payer: Self-pay | Admitting: Medical

## 2019-11-25 ENCOUNTER — Other Ambulatory Visit: Payer: Self-pay

## 2019-11-25 ENCOUNTER — Ambulatory Visit (INDEPENDENT_AMBULATORY_CARE_PROVIDER_SITE_OTHER): Payer: BC Managed Care – PPO | Admitting: Medical

## 2019-11-25 VITALS — Ht 63.0 in | Wt 240.0 lb

## 2019-11-25 DIAGNOSIS — R5383 Other fatigue: Secondary | ICD-10-CM

## 2019-11-25 DIAGNOSIS — J0100 Acute maxillary sinusitis, unspecified: Secondary | ICD-10-CM

## 2019-11-25 DIAGNOSIS — J3489 Other specified disorders of nose and nasal sinuses: Secondary | ICD-10-CM

## 2019-11-25 MED ORDER — FLUCONAZOLE 150 MG PO TABS: 150.0000 mg | ORAL_TABLET | Freq: Once | ORAL | 0 refills | Status: AC

## 2019-11-25 MED ORDER — FLUTICASONE PROPIONATE 50 MCG/ACT NA SUSP: 2.0000 | Freq: Every day | NASAL | 1 refills | Status: DC

## 2019-11-25 MED ORDER — BENZONATATE 100 MG PO CAPS: 100.0000 mg | ORAL_CAPSULE | Freq: Three times a day (TID) | ORAL | 0 refills | Status: DC | PRN

## 2019-11-25 MED ORDER — DOXYCYCLINE HYCLATE 100 MG PO TABS: 100.0000 mg | ORAL_TABLET | Freq: Two times a day (BID) | ORAL | 0 refills | Status: DC

## 2019-11-25 NOTE — Patient Instructions (Signed)
You do appear to have 1 month of sinus pressure/infection type symptoms.  For this we will prescribe doxycycline antibiotic.  Rx advisement given.  For nasal congestion I prescribed Flonase.  For intermittent cough, I prescribed benzonatate.  Towards the end of last week you had moderate to severe fatigue associated with the sinus pressure.  You went to get Covid testing and the rapid result came back negative.  Send out result is pending.  Recommend that you quarantine pending send out test result.  Let me know the results of test when it comes in.  Follow-up in 7 days or as needed.

## 2019-11-25 NOTE — Progress Notes (Signed)
Subjective:    Patient ID: Valerie Burke, female    DOB: Sep 02, 1976, 44 y.o.   MRN: [id number]  HPI   Virtual Visit via Video Note  I connected with Valerie Burke on 11/25/19 at  9:20 AM EST by a video enabled telemedicine application and verified that I am speaking with the correct person using two identifiers.  Location: Patient: car Provider: office   I discussed the limitations of evaluation and management by telemedicine and the availability of in person appointments. The patient expressed understanding and agreed to proceed.  History of Present Illness:  Pt has some sinus pressure for entire month. Pt states she went ahead and got tested for covid since many persons were warning her to get tested. Pt got tested this past Thursday. She had rapid test and send out test. Rapid test came back negative.  Pt states feeling tired with nasal congestion and sinus pressure.  Pt states she went to UC on Thursday.   Pt was given prednisone. Pt did give her antibiotic. She has pain frontal and rt maxillary sinus pressure. Pt also taking mucinex otc.   Pt states mucus feels stuck. Not blowing out any mucus. She went to [facility].  Pt has no known exposure to person with covid.     Observations/Objective:  General-no acute distress, pleasant, oriented. Lungs- on inspection lungs appear unlabored. Neck- no tracheal deviation or jvd on inspection. Neuro- gross motor function appears intact. heent- frontal sinus pressure and rt side maxillary sinus pressure on palpation.   Assessment and Plan: You do appear to have 1 month of sinus pressure/infection type symptoms.  For this we will prescribe doxycycline antibiotic.  Rx advisement given.  For nasal congestion I prescribed Flonase.  For intermittent cough, I prescribed benzonatate.  Towards the end of last week you had moderate to severe fatigue associated with the sinus pressure.  You went to get Covid testing and the rapid result  came back negative.  Send out result is pending.  Recommend that you quarantine pending send out test result.  Let me know the results of test when it comes in.  Follow-up in 7 days or as needed.  Follow Up Instructions:    I discussed the assessment and treatment plan with the patient. The patient was provided an opportunity to ask questions and all were answered. The patient agreed with the plan and demonstrated an understanding of the instructions.   The patient was advised to call back or seek an in-person evaluation if the symptoms worsen or if the condition fails to improve as anticipated.  I provided  20  minutes of non-face-to-face time during this encounter.   Esperanza Richters, PA-C   Review of Systems  Constitutional: Positive for fatigue. Negative for chills and fever.       Recent severe fatigue toward end of last week.  HENT: Positive for congestion and sinus pain.   Respiratory: Positive for cough. Negative for chest tightness, shortness of breath and wheezing.   Cardiovascular: Negative for chest pain and palpitations.  Gastrointestinal: Negative for abdominal pain.  Musculoskeletal: Positive for myalgias. Negative for back pain.       Mild achy early in month.  Neurological: Negative for facial asymmetry.  Hematological: Negative for adenopathy. Does not bruise/bleed easily.  Psychiatric/Behavioral: Negative for confusion.    Past Medical History:  Diagnosis Date  . Allergy   . Anemia   . Anxiety   . GERD (gastroesophageal reflux disease)   .  OSA (obstructive sleep apnea) 02/03/2017     Social History   Socioeconomic History  . Marital status: Married    Spouse name: Not on file  . Number of children: 3  . Years of education: Not on file  . Highest education level: Not on file  Occupational History  . Occupation: part time  Tobacco Use  . Smoking status: Never Smoker  . Smokeless tobacco: Never Used  Substance and Sexual Activity  . Alcohol use: Yes      Comment: 2-3 glasses of wine  . Drug use: No  . Sexual activity: Never    Birth control/protection: I.U.D.  Other Topics Concern  . Not on file  Social History Narrative  . Not on file   Social Determinants of Health   Financial Resource Strain:   . Difficulty of Paying Living Expenses: Not on file  Food Insecurity:   . Worried About Charity fundraiser in the Last Year: Not on file  . Ran Out of Food in the Last Year: Not on file  Transportation Needs:   . Lack of Transportation (Medical): Not on file  . Lack of Transportation (Non-Medical): Not on file  Physical Activity:   . Days of Exercise per Week: Not on file  . Minutes of Exercise per Session: Not on file  Stress:   . Feeling of Stress : Not on file  Social Connections:   . Frequency of Communication with Friends and Family: Not on file  . Frequency of Social Gatherings with Friends and Family: Not on file  . Attends Religious Services: Not on file  . Active Member of Clubs or Organizations: Not on file  . Attends Archivist Meetings: Not on file  . Marital Status: Not on file  Intimate Partner Violence:   . Fear of Current or Ex-Partner: Not on file  . Emotionally Abused: Not on file  . Physically Abused: Not on file  . Sexually Abused: Not on file    Past Surgical History:  Procedure Laterality Date  . WISDOM TOOTH EXTRACTION     age 54    Family History  Problem Relation Age of Onset  . Asthma Mother   . Hypertension Father   . Hypertension Paternal Uncle   . Heart disease Maternal Grandmother   . Asthma Brother     Allergies  Allergen Reactions  . Levofloxacin Rash  . Tramadol Nausea Only and Other (See Comments)    Tingling in feet, headache, body temp change from hot to cold.    Current Outpatient Medications on File Prior to Visit  Medication Sig Dispense Refill  . albuterol (PROVENTIL HFA;VENTOLIN HFA) 108 (90 Base) MCG/ACT inhaler Inhale 2 puffs into the lungs every 6 (six)  hours as needed for wheezing or shortness of breath. 1 Inhaler 2  . cetirizine (ZYRTEC) 10 MG tablet Take 10 mg by mouth daily.    . clonazePAM (KLONOPIN) 0.5 MG tablet Take 1 tablet (0.5 mg total) by mouth 2 (two) times daily as needed for anxiety. 14 tablet 0  . diclofenac (VOLTAREN) 75 MG EC tablet Take 1 tablet (75 mg total) by mouth 2 (two) times daily. 30 tablet 0  . hydrOXYzine (ATARAX/VISTARIL) 10 MG tablet 1 to 2 tablets at night prior to sleep for insomnia or itching. 30 tablet 0  . levonorgestrel (MIRENA, 52 MG,) 20 MCG/24HR IUD Mirena 20 mcg/24 hours (5 yrs) 52 mg intrauterine device    . predniSONE (DELTASONE) 20 MG tablet Take 20  mg by mouth 2 (two) times daily.    Marland Kitchen PROAIR HFA 108 (90 Base) MCG/ACT inhaler INHALE 2 PUFFS INTO THE LUNGS EVERY 6 HOURS AS NEEDED FOR WHEEZING OR SHORTNESS OF BREATH 8.5 g 0   No current facility-administered medications on file prior to visit.    Ht 5\' 3"  (1.6 m)   Wt 240 lb (108.9 kg)   BMI 42.51 kg/m       Objective:   Physical Exam        Assessment & Plan:

## 2019-12-16 LAB — HM PAP SMEAR

## 2019-12-17 ENCOUNTER — Other Ambulatory Visit: Payer: Self-pay | Admitting: Medical

## 2019-12-17 DIAGNOSIS — Z6841 Body Mass Index (BMI) 40.0 and over, adult: Secondary | ICD-10-CM | POA: Diagnosis not present

## 2019-12-17 DIAGNOSIS — Z124 Encounter for screening for malignant neoplasm of cervix: Secondary | ICD-10-CM | POA: Diagnosis not present

## 2019-12-17 DIAGNOSIS — Z01419 Encounter for gynecological examination (general) (routine) without abnormal findings: Secondary | ICD-10-CM | POA: Diagnosis not present

## 2020-01-01 ENCOUNTER — Other Ambulatory Visit: Payer: Self-pay

## 2020-01-01 ENCOUNTER — Encounter (INDEPENDENT_AMBULATORY_CARE_PROVIDER_SITE_OTHER): Payer: Self-pay | Admitting: Bariatrics

## 2020-01-01 ENCOUNTER — Ambulatory Visit (INDEPENDENT_AMBULATORY_CARE_PROVIDER_SITE_OTHER): Payer: BC Managed Care – PPO | Admitting: Bariatrics

## 2020-01-01 VITALS — BP 132/84 | HR 91 | Temp 98.2°F | Ht 64.0 in | Wt 249.0 lb

## 2020-01-01 DIAGNOSIS — Z9189 Other specified personal risk factors, not elsewhere classified: Secondary | ICD-10-CM

## 2020-01-01 DIAGNOSIS — R5383 Other fatigue: Secondary | ICD-10-CM | POA: Diagnosis not present

## 2020-01-01 DIAGNOSIS — K219 Gastro-esophageal reflux disease without esophagitis: Secondary | ICD-10-CM | POA: Diagnosis not present

## 2020-01-01 DIAGNOSIS — Z1331 Encounter for screening for depression: Secondary | ICD-10-CM

## 2020-01-01 DIAGNOSIS — D508 Other iron deficiency anemias: Secondary | ICD-10-CM

## 2020-01-01 DIAGNOSIS — Z0289 Encounter for other administrative examinations: Secondary | ICD-10-CM

## 2020-01-01 DIAGNOSIS — G4733 Obstructive sleep apnea (adult) (pediatric): Secondary | ICD-10-CM

## 2020-01-01 DIAGNOSIS — Z6841 Body Mass Index (BMI) 40.0 and over, adult: Secondary | ICD-10-CM | POA: Insufficient documentation

## 2020-01-01 DIAGNOSIS — R7401 Elevation of levels of liver transaminase levels: Secondary | ICD-10-CM | POA: Diagnosis not present

## 2020-01-01 DIAGNOSIS — R0602 Shortness of breath: Secondary | ICD-10-CM

## 2020-01-01 DIAGNOSIS — D72829 Elevated white blood cell count, unspecified: Secondary | ICD-10-CM | POA: Diagnosis not present

## 2020-01-01 NOTE — Progress Notes (Signed)
Dear Dr. Esperanza Richters,   Thank you for referring Valerie Burke to our clinic. The following note includes my evaluation and treatment recommendations.  Chief Complaint:   OBESITY Valerie Burke (MR# [id number]) is a 44 y.o. female who presents for evaluation and treatment of obesity and related comorbidities. Current BMI is Body mass index is 42.74 kg/m.Valerie Burke has been struggling with her weight for many years and has been unsuccessful in either losing weight, maintaining weight loss, or reaching her healthy weight goal.  Valerie Burke is currently in the action stage of change and ready to dedicate time achieving and maintaining a healthier weight. Valerie Burke is interested in becoming our patient and working on intensive lifestyle modifications including (but not limited to) diet and exercise for weight loss.  Valerie Burke does not mind cooking, but notes schedule and children as an obstacle. She craves sweets.  Valerie Burke's habits were reviewed today and are as follows: Her family eats meals together, she thinks her family will eat healthier with her, her desired weight loss is 84 lbs, she started gaining weight in her 20's, her heaviest weight ever was 249 pounds, she craves Cokes and sweets, she skips breakfast and she is frequently drinking liquids with calories.  Depression Screen [name]'s Food and Mood (modified PHQ-9) score was 11.  Depression screen PHQ 2/9 01/01/2020  Decreased Interest 1  Down, Depressed, Hopeless 1  PHQ - 2 Score 2  Altered sleeping 3  Tired, decreased energy 3  Change in appetite 1  Feeling bad or failure about yourself  1  Trouble concentrating 0  Moving slowly or fidgety/restless 1  Suicidal thoughts 0  PHQ-9 Score 11  Difficult doing work/chores Not difficult at all   Subjective:   Other fatigue. Valerie Burke denies daytime somnolence and admits to waking up still tired. Patent has a history of symptoms of some morning headaches. Valerie Burke generally  gets 8 hours of sleep per night, and states that she does not sleep well most nights. Snoring is present. Apneic episodes are not present. Epworth Sleepiness Score is 9.  Shortness of breath on exertion. Valerie Burke notes increasing shortness of breath with certain activities and seems to be worsening over time with weight gain. She notes getting out of breath sooner with activity than she used to. This has not gotten worse recently. Valerie Burke denies shortness of breath at rest or orthopnea. We reviewed [place] sleep study from 02/01/2017.  OSA (obstructive sleep apnea). Valerie Burke has mild OSA and does not wear CPAP.  Other iron deficiency anemia. There is a question if this is hereditary. She has a Mirena IUD and is having no periods.   CBC Latest Ref Rng & Units 04/29/2019 01/30/2019 10/12/2016  WBC 4.0 - 10.5 K/uL 13.4(H) 11.9(H) 11.6(H)  Hemoglobin 12.0 - 15.0 g/dL 66.0 63.0 16.0  Hematocrit 36.0 - 46.0 % 36.2 37.7 37.5  Platelets 150.0 - 400.0 K/uL 365.0 318.0 348.0   Lab Results  Component Value Date   IRON 41 (L) 04/21/2010   TIBC 312 04/21/2010   FERRITIN 22 04/21/2010   Lab Results  Component Value Date   VITAMINB12 671 04/21/2010   Gastroesophageal reflux disease, unspecified whether esophagitis present. Larose takes Famotidine.   At risk for activity intolerance. Ladavia is at risk of exercise intolerance due to obesity and anemia.  Depression screening. Valerie Burke has a moderately positive depression screen with a PHQ-9 score of 11.  Assessment/Plan:   Other fatigue. Valerie Burke does not feel that her weight is causing her  energy to be lower than it should be. Fatigue may be related to obesity, depression or many other causes. Labs will be ordered, and in the meanwhile, Valerie Burke will focus on self care including making healthy food choices, increasing physical activity and focusing on stress reduction. EKG 12-Lead, Comprehensive metabolic panel, Hemoglobin A1c, Insulin, random,  VITAMIN D 25 Hydroxy (Vit-D Deficiency, Fractures) ordered.  Shortness of breath on exertion. Valerie Burke does not feel that she gets out of breath more easily that she used to when she exercises. Valerie Burke's shortness of breath appears to be obesity related and exercise induced. She has agreed to work on weight loss and gradually increase exercise to treat her exercise induced shortness of breath. Will continue to monitor closely. Valerie Burke will be referred to [facility].  OSA (obstructive sleep apnea). Intensive lifestyle modifications are the first line treatment for this issue. We discussed several lifestyle modifications today and she will continue to work on diet, exercise and weight loss efforts. We will continue to monitor. Orders and follow up as documented in patient record. Ambulatory referral to Pulmonology.  Counseling  Sleep apnea is a condition in which breathing pauses or becomes shallow during sleep. This happens over and over during the night. This disrupts your sleep and keeps your body from getting the rest that it needs, which can cause tiredness and lack of energy (fatigue) during the day.  Sleep apnea treatment: If you were given a device to open your airway while you sleep, USE IT!  Sleep hygiene:   Limit or avoid alcohol, caffeinated beverages, and cigarettes, especially close to bedtime.   Do not eat a large meal or eat spicy foods right before bedtime. This can lead to digestive discomfort that can make it hard for you to sleep.  Keep a sleep diary to help you and your health care provider figure out what could be causing your insomnia.   Make your bedroom a dark, comfortable place where it is easy to fall asleep. ? Put up shades or blackout curtains to block light from outside. ? Use a white noise machine to block noise. ? Keep the temperature cool.  Limit screen use before bedtime. This includes: ? Watching TV. ? Using your smartphone, tablet, or  computer.  Stick to a routine that includes going to bed and waking up at the same times every day and night. This can help you fall asleep faster. Consider making a quiet activity, such as reading, part of your nighttime routine.  Try to avoid taking naps during the day so that you sleep better at night.  Get out of bed if you are still awake after 15 minutes of trying to sleep. Keep the lights down, but try reading or doing a quiet activity. When you feel sleepy, go back to bed.  Other iron deficiency anemia. Orders and follow up as documented in patient record. CBC with Differential/Platelet lab ordered.  Counseling  Iron is essential for our bodies to make red blood cells.  Reasons that someone may be deficient include: an iron-deficient diet (more likely in those following vegan or vegetarian diets), women with heavy menses, patients with GI disorders or poor absorption, patients that have had bariatric surgery, frequent blood donors, patients with cancer, and patients with heart disease.    An iron supplement has been recommended. This is found over-the-counter.   Iron-rich foods include dark leafy greens, red and white meats, eggs, seafood, and beans.    Certain foods and drinks prevent your  body from absorbing iron properly. Avoid eating these foods in the same meal as iron-rich foods or with iron supplements. These foods include: coffee, black tea, and red wine; milk, dairy products, and foods that are high in calcium; beans and soybeans; whole grains.   Constipation can be a side effect of iron supplementation. Increased water and fiber intake are helpful. Water goal: > 2 liters/day. Fiber goal: > 25 grams/day.   Gastroesophageal reflux disease, unspecified whether esophagitis present. Intensive lifestyle modifications are the first line treatment for this issue. We discussed several lifestyle modifications today and she will continue to work on diet, exercise and weight loss efforts.  Orders and follow up as documented in patient record. Deanda will take the Famotidine as needed.  Counseling  If a person has gastroesophageal reflux disease (GERD), food and stomach acid move back up into the esophagus and cause symptoms or problems such as damage to the esophagus.  Anti-reflux measures include: raising the head of the bed, avoiding tight clothing or belts, avoiding eating late at night, not lying down shortly after mealtime, and achieving weight loss.  Avoid ASA, NSAID's, caffeine, alcohol, and tobacco.   OTC Pepcid and/or Tums are often very helpful for as needed use.   However, for persisting chronic or daily symptoms, stronger medications like Omeprazole may be needed.  You may need to avoid foods and drinks such as: ? Coffee and tea (with or without caffeine). ? Drinks that contain alcohol. ? Energy drinks and sports drinks. ? Bubbly (carbonated) drinks or sodas. ? Chocolate and cocoa. ? Peppermint and mint flavorings. ? Garlic and onions. ? Horseradish. ? Spicy and acidic foods. These include peppers, chili powder, curry powder, vinegar, hot sauces, and BBQ sauce. ? Citrus fruit juices and citrus fruits, such as oranges, lemons, and limes. ? Tomato-based foods. These include red sauce, chili, salsa, and pizza with red sauce. ? Fried and fatty foods. These include donuts, french fries, potato chips, and high-fat dressings. ? High-fat meats. These include hot dogs, rib eye steak, sausage, ham, and bacon.  At risk for activity intolerance. Taiyana was given approximately 15 minutes of exercise intolerance counseling today. She is 44 y.o. female and has risk factors exercise intolerance including obesity. We discussed intensive lifestyle modifications today with an emphasis on specific weight loss instructions and strategies. Yaqueline will slowly increase activity as tolerated.  Repetitive spaced learning was employed today to elicit superior memory formation and  behavioral change.  Depression screening. Shiah had a positive depression screening. Depression is commonly associated with obesity and often results in emotional eating behaviors. We will monitor this closely and work on CBT to help improve the non-hunger eating patterns. Referral to Psychology may be required if no improvement is seen as she continues in our clinic.  Class 3 severe obesity with serious comorbidity and body mass index (BMI) of 40.0 to 44.9 in adult, unspecified obesity type (HCC).  Wanna is currently in the action stage of change and her goal is to continue with weight loss efforts. I recommend Charrie begin the structured treatment plan as follows:  She has agreed to the Category 2 Plan + 100 calories with goals for breakfast, lunch, and dinner. She will journal 200-300 calories and 20+ grams of protein at breakfast.  We reviewed labs with the patient from 04/29/2019 including CMP, lipids, CBC, A1c, TSH, and triglycerides.  She will work on meal planning and will stop all sugary drinks.  Exercise goals: All adults should avoid inactivity. Some  physical activity is better than none, and adults who participate in any amount of physical activity gain some health benefits.   Behavioral modification strategies: increasing lean protein intake, decreasing simple carbohydrates, increasing vegetables, increasing water intake, decreasing eating out, no skipping meals, meal planning and cooking strategies, keeping healthy foods in the home and planning for success.  She was informed of the importance of frequent follow-up visits to maximize her success with intensive lifestyle modifications for her multiple health conditions. She was informed we would discuss her lab results at her next visit unless there is a critical issue that needs to be addressed sooner. Beatric agreed to keep her next visit at the agreed upon time to discuss these results.  Objective:   Blood pressure  132/84, pulse 91, temperature 98.2 F (36.8 C), height 5\' 4"  (1.626 m), weight 249 lb (112.9 kg), SpO2 99 %. Body mass index is 42.74 kg/m.  EKG: Sinus  Rhythm with a rate of 92 BPM. Short PR syndrome. PRi = 118. Otherwise normal.  Indirect Calorimeter completed today shows a VO2 of 271 and a REE of 1888.  Her calculated basal metabolic rate is thus her basal metabolic rate is better than expected.  General: Cooperative, alert, well developed, in no acute distress. HEENT: Conjunctivae and lids unremarkable. Cardiovascular: Regular rhythm.  Lungs: Normal work of breathing. Neurologic: No focal deficits.   Lab Results  Component Value Date   CREATININE 0.63 04/29/2019   BUN 12 04/29/2019   NA 137 04/29/2019   K 4.1 04/29/2019   CL 103 04/29/2019   CO2 26 04/29/2019   Lab Results  Component Value Date   ALT 50 (H) 04/29/2019   AST 32 04/29/2019   ALKPHOS 78 04/29/2019   BILITOT 0.4 04/29/2019   Lab Results  Component Value Date   HGBA1C 5.6 04/29/2019   No results found for: INSULIN Lab Results  Component Value Date   TSH 1.62 04/29/2019   Lab Results  Component Value Date   CHOL 158 04/29/2019   HDL 41.20 04/29/2019   LDLCALC 92 04/29/2019   TRIG 128.0 04/29/2019   CHOLHDL 4 04/29/2019   Lab Results  Component Value Date   WBC 13.4 (H) 04/29/2019   HGB 12.1 04/29/2019   HCT 36.2 04/29/2019   MCV 89.1 04/29/2019   PLT 365.0 04/29/2019   Lab Results  Component Value Date   IRON 41 (L) 04/21/2010   TIBC 312 04/21/2010   FERRITIN 22 04/21/2010   Attestation Statements:   Reviewed by clinician on day of visit: allergies, medications, problem list, medical history, surgical history, family history, social history, and previous encounter notes.  06/21/2010, am acting as Fernanda Drum for Energy manager, DO   I have reviewed the above documentation for accuracy and completeness, and I agree with the above. [employer], DO

## 2020-01-02 LAB — COMPREHENSIVE METABOLIC PANEL
ALT: 42 IU/L — ABNORMAL HIGH (ref 0–32)
AST: 30 IU/L (ref 0–40)
Albumin/Globulin Ratio: 1.7 (ref 1.2–2.2)
Albumin: 4.2 g/dL (ref 3.8–4.8)
Alkaline Phosphatase: 97 IU/L (ref 39–117)
BUN/Creatinine Ratio: 14 (ref 9–23)
BUN: 9 mg/dL (ref 6–24)
Bilirubin Total: 0.4 mg/dL (ref 0.0–1.2)
CO2: 22 mmol/L (ref 20–29)
Calcium: 8.9 mg/dL (ref 8.7–10.2)
Chloride: 106 mmol/L (ref 96–106)
Creatinine, Ser: 0.65 mg/dL (ref 0.57–1.00)
GFR calc Af Amer: 125 mL/min/{1.73_m2} (ref >59)
GFR calc non Af Amer: 108 mL/min/{1.73_m2} (ref >59)
Globulin, Total: 2.5 g/dL (ref 1.5–4.5)
Glucose: 98 mg/dL (ref 65–99)
Potassium: 4.3 mmol/L (ref 3.5–5.2)
Sodium: 143 mmol/L (ref 134–144)
Total Protein: 6.7 g/dL (ref 6.0–8.5)

## 2020-01-02 LAB — CBC WITH DIFFERENTIAL/PLATELET
Basophils Absolute: 0.1 10*3/uL (ref 0.0–0.2)
Basos: 1 %
EOS (ABSOLUTE): 0.2 10*3/uL (ref 0.0–0.4)
Eos: 2 %
Hematocrit: 40.1 % (ref 34.0–46.6)
Hemoglobin: 13.3 g/dL (ref 11.1–15.9)
Immature Grans (Abs): 0.1 10*3/uL (ref 0.0–0.1)
Immature Granulocytes: 1 %
Lymphocytes Absolute: 3.2 10*3/uL — ABNORMAL HIGH (ref 0.7–3.1)
Lymphs: 26 %
MCH: 29.8 pg (ref 26.6–33.0)
MCHC: 33.2 g/dL (ref 31.5–35.7)
MCV: 90 fL (ref 79–97)
Monocytes Absolute: 0.8 10*3/uL (ref 0.1–0.9)
Monocytes: 6 %
Neutrophils Absolute: 8 10*3/uL — ABNORMAL HIGH (ref 1.4–7.0)
Neutrophils: 64 %
Platelets: 366 10*3/uL (ref 150–450)
RBC: 4.46 x10E6/uL (ref 3.77–5.28)
RDW: 13.1 % (ref 11.7–15.4)
WBC: 12.3 10*3/uL — ABNORMAL HIGH (ref 3.4–10.8)

## 2020-01-02 LAB — VITAMIN D 25 HYDROXY (VIT D DEFICIENCY, FRACTURES): Vit D, 25-Hydroxy: 16.7 ng/mL — ABNORMAL LOW (ref 30.0–100.0)

## 2020-01-02 LAB — HEMOGLOBIN A1C
Est. average glucose Bld gHb Est-mCnc: 120 mg/dL
Hgb A1c MFr Bld: 5.8 % — ABNORMAL HIGH (ref 4.8–5.6)

## 2020-01-02 LAB — INSULIN, RANDOM: INSULIN: 24.9 u[IU]/mL (ref 2.6–24.9)

## 2020-01-06 ENCOUNTER — Encounter (INDEPENDENT_AMBULATORY_CARE_PROVIDER_SITE_OTHER): Payer: Self-pay | Admitting: Bariatrics

## 2020-01-06 DIAGNOSIS — R7401 Elevation of levels of liver transaminase levels: Secondary | ICD-10-CM | POA: Insufficient documentation

## 2020-01-06 DIAGNOSIS — R7303 Prediabetes: Secondary | ICD-10-CM | POA: Insufficient documentation

## 2020-01-06 DIAGNOSIS — E559 Vitamin D deficiency, unspecified: Secondary | ICD-10-CM | POA: Insufficient documentation

## 2020-01-15 ENCOUNTER — Encounter (INDEPENDENT_AMBULATORY_CARE_PROVIDER_SITE_OTHER): Payer: Self-pay | Admitting: Bariatrics

## 2020-01-15 ENCOUNTER — Ambulatory Visit (INDEPENDENT_AMBULATORY_CARE_PROVIDER_SITE_OTHER): Payer: BC Managed Care – PPO | Admitting: Bariatrics

## 2020-01-15 ENCOUNTER — Other Ambulatory Visit: Payer: Self-pay

## 2020-01-15 VITALS — BP 140/81 | HR 88 | Temp 98.6°F | Ht 64.0 in | Wt 246.0 lb

## 2020-01-15 DIAGNOSIS — E559 Vitamin D deficiency, unspecified: Secondary | ICD-10-CM | POA: Diagnosis not present

## 2020-01-15 DIAGNOSIS — R7989 Other specified abnormal findings of blood chemistry: Secondary | ICD-10-CM | POA: Diagnosis not present

## 2020-01-15 DIAGNOSIS — L820 Inflamed seborrheic keratosis: Secondary | ICD-10-CM | POA: Diagnosis not present

## 2020-01-15 DIAGNOSIS — L910 Hypertrophic scar: Secondary | ICD-10-CM | POA: Diagnosis not present

## 2020-01-15 DIAGNOSIS — Z9189 Other specified personal risk factors, not elsewhere classified: Secondary | ICD-10-CM | POA: Diagnosis not present

## 2020-01-15 DIAGNOSIS — R7303 Prediabetes: Secondary | ICD-10-CM | POA: Diagnosis not present

## 2020-01-15 DIAGNOSIS — Z6841 Body Mass Index (BMI) 40.0 and over, adult: Secondary | ICD-10-CM

## 2020-01-15 MED ORDER — VITAMIN D (ERGOCALCIFEROL) 1.25 MG (50000 UNIT) PO CAPS: 50000.0000 [IU] | ORAL_CAPSULE | ORAL | 0 refills | Status: DC

## 2020-01-15 NOTE — Progress Notes (Signed)
Chief Complaint:   Valerie Burke is here to discuss her progress with her obesity treatment plan along with follow-up of her obesity related diagnoses. Valerie Burke is on the Category 2 Plan + 100 calories and states she is following her eating plan approximately 50% of the time. Valerie Burke states she is exercising 0 minutes 0 times per week.  Today's visit was #: 2 Starting weight: 249 lbs Starting date: 01/01/2020 Today's weight: 246 lbs Today's date: 01/15/2020 Total lbs lost to date: 3 Total lbs lost since last in-office visit: 3  Interim History: Valerie Burke is down 3 lbs. She has cut out her soda. She states it is easier to do the plan at work.  Subjective:   Elevated LFTs. Valerie Burke has a new dx of elevated ALT (improving with a value of 42 - down from 50). Her BMI is over 40. She denies abdominal pain or jaundice and has never been told of any liver problems in the past. She denies excessive alcohol intake.  Lab Results  Component Value Date   ALT 42 (H) 01/01/2020   AST 30 01/01/2020   ALKPHOS 97 01/01/2020   BILITOT 0.4 01/01/2020   Vitamin D deficiency. Last Vitamin D 16.7 on 01/01/2020.  Prediabetes. Valerie Burke has a diagnosis of prediabetes based on her elevated HgA1c and was informed this puts her at greater risk of developing diabetes. She continues to work on diet and exercise to decrease her risk of diabetes. She denies nausea or hypoglycemia. Appetite fluctuates.  Lab Results  Component Value Date   HGBA1C 5.8 (H) 01/01/2020   Lab Results  Component Value Date   INSULIN 24.9 01/01/2020   At risk for osteoporosis. Valerie Burke is at higher risk of osteopenia and osteoporosis due to Vitamin D deficiency.   Assessment/Plan:   Elevated LFTs. We discussed the likely diagnosis of non-alcoholic fatty liver disease today and how this condition is obesity related. Valerie Burke was educated the importance of weight loss. Valerie Burke agreed to continue with her weight loss  efforts with healthier diet and exercise as an essential part of her treatment plan. Will watch ALT.  Vitamin D deficiency. Low Vitamin D level contributes to fatigue and are associated with obesity, breast, and colon cancer. She was given a prescription for Vitamin D, Ergocalciferol, (DRISDOL) 1.25 MG (50000 UNIT) CAPS capsule every week #4 with 0 refills and will follow-up for routine testing of Vitamin D, at least 2-3 times per year to avoid over-replacement.    Prediabetes. Valerie Burke will continue to work on weight loss, exercise, increasing activity, decreasing simple carbohydrates to help decrease the risk of diabetes, and will read labels.   At risk for osteoporosis. Valerie Burke was given approximately 15 minutes of osteoporosis prevention counseling today. Valerie Burke is at risk for osteopenia and osteoporosis due to her Vitamin D deficiency. She was encouraged to take her Vitamin D and follow her higher calcium diet and increase strengthening exercise to help strengthen her bones and decrease her risk of osteopenia and osteoporosis.  Repetitive spaced learning was employed today to elicit superior memory formation and behavioral change.  Class 3 severe obesity with serious comorbidity and body mass index (BMI) of 40.0 to 44.9 in adult, unspecified obesity type (Valerie Burke).  Valerie Burke is currently in the action stage of change. As such, her goal is to continue with weight loss efforts. She has agreed to the Category 2 Plan + 100 calories.  She will work on meal planning and intentional eating.  We reviewed labs  from 01/01/2020 with the patient including CMP, lipids, Vitamin D, CBC, A1c, and insulin.  Handout was given on Eating Out.   Exercise goals: All adults should avoid inactivity. Some physical activity is better than none, and adults who participate in any amount of physical activity gain some health benefits.  Behavioral modification strategies: increasing lean protein intake, decreasing  simple carbohydrates, increasing vegetables, increasing water intake, decreasing liquid calories, decreasing eating out, no skipping meals, meal planning and cooking strategies, keeping healthy foods in the home and planning for success.  Valerie Burke has agreed to follow-up with our clinic in 3 weeks. She was informed of the importance of frequent follow-up visits to maximize her success with intensive lifestyle modifications for her multiple health conditions.   Objective:   Blood pressure 140/81, pulse 88, temperature 98.6 F (37 C), height 5\' 4"  (1.626 m), weight 246 lb (111.6 kg), SpO2 96 %. Body mass index is 42.23 kg/m.  General: Cooperative, alert, well developed, in no acute distress. HEENT: Conjunctivae and lids unremarkable. Cardiovascular: Regular rhythm.  Lungs: Normal work of breathing. Neurologic: No focal deficits.   Lab Results  Component Value Date   CREATININE 0.65 01/01/2020   BUN 9 01/01/2020   NA 143 01/01/2020   K 4.3 01/01/2020   CL 106 01/01/2020   CO2 22 01/01/2020   Lab Results  Component Value Date   ALT 42 (H) 01/01/2020   AST 30 01/01/2020   ALKPHOS 97 01/01/2020   BILITOT 0.4 01/01/2020   Lab Results  Component Value Date   HGBA1C 5.8 (H) 01/01/2020   HGBA1C 5.6 04/29/2019   Lab Results  Component Value Date   INSULIN 24.9 01/01/2020   Lab Results  Component Value Date   TSH 1.62 04/29/2019   Lab Results  Component Value Date   CHOL 158 04/29/2019   HDL 41.20 04/29/2019   LDLCALC 92 04/29/2019   TRIG 128.0 04/29/2019   CHOLHDL 4 04/29/2019   Lab Results  Component Value Date   WBC 12.3 (H) 01/01/2020   HGB 13.3 01/01/2020   HCT 40.1 01/01/2020   MCV 90 01/01/2020   PLT 366 01/01/2020   Lab Results  Component Value Date   IRON 41 (L) 04/21/2010   TIBC 312 04/21/2010   FERRITIN 22 04/21/2010   Attestation Statements:   Reviewed by clinician on day of visit: allergies, medications, problem list, medical history, surgical  history, family history, social history, and previous encounter notes.  06/21/2010, am acting as Fernanda Drum for Energy manager, DO   I have reviewed the above documentation for accuracy and completeness, and I agree with the above. [employer], DO

## 2020-01-29 ENCOUNTER — Ambulatory Visit (INDEPENDENT_AMBULATORY_CARE_PROVIDER_SITE_OTHER): Payer: BC Managed Care – PPO | Admitting: Family Medicine

## 2020-01-29 ENCOUNTER — Other Ambulatory Visit: Payer: Self-pay

## 2020-01-29 ENCOUNTER — Encounter (INDEPENDENT_AMBULATORY_CARE_PROVIDER_SITE_OTHER): Payer: Self-pay | Admitting: Family Medicine

## 2020-01-29 VITALS — BP 131/88 | HR 85 | Temp 97.9°F | Ht 64.0 in | Wt 243.0 lb

## 2020-01-29 DIAGNOSIS — Z9189 Other specified personal risk factors, not elsewhere classified: Secondary | ICD-10-CM | POA: Diagnosis not present

## 2020-01-29 DIAGNOSIS — E559 Vitamin D deficiency, unspecified: Secondary | ICD-10-CM | POA: Diagnosis not present

## 2020-01-29 DIAGNOSIS — Z6841 Body Mass Index (BMI) 40.0 and over, adult: Secondary | ICD-10-CM

## 2020-01-29 DIAGNOSIS — R7303 Prediabetes: Secondary | ICD-10-CM | POA: Diagnosis not present

## 2020-01-29 MED ORDER — METFORMIN HCL 500 MG PO TABS: 500.0000 mg | ORAL_TABLET | Freq: Every day | ORAL | 0 refills | Status: DC

## 2020-01-30 ENCOUNTER — Encounter (INDEPENDENT_AMBULATORY_CARE_PROVIDER_SITE_OTHER): Payer: Self-pay | Admitting: Family Medicine

## 2020-01-30 NOTE — Progress Notes (Signed)
Chief Complaint:   OBESITY TYLESHA GIBEAULT is here to discuss her progress with her obesity treatment plan along with follow-up of her obesity related diagnoses. Monesha is on the Category 2 Plan + 100 calories and states she is following her eating plan approximately 50% of the time. Emon states she is exercising 0 minutes 0 times per week.  Today's visit was #: 3 Starting weight: 249 lbs Starting date: 01/01/2020 Today's weight: 243 lbs Today's date: 01/29/2020 Total lbs lost to date: 6 Total lbs lost since last in-office visit: 3  Interim History: Jase is doing well on the plan, but reports some hunger. She is still not drinking sodas and she had been having several per day.  Subjective:   Prediabetes. Allix has a diagnosis of prediabetes based on her elevated HgA1c and was informed this puts her at greater risk of developing diabetes. She continues to work on diet and exercise to decrease her risk of diabetes. She denies nausea or hypoglycemia. Mercedees endorses polyphagia. She is not on metformin.  Lab Results  Component Value Date   HGBA1C 5.8 (H) 01/01/2020   Lab Results  Component Value Date   INSULIN 24.9 01/01/2020   Vitamin D deficiency. Jaryah has not started Vitamin D yet. She endorses fatigue. Last Vitamin D 16.7 on 01/01/2020.  At risk for diabetes mellitus. Renna is at higher than average risk for developing diabetes due to her obesity and prediabetic status.  Assessment/Plan:   Prediabetes. Myalee will continue to work on weight loss, exercise, and decreasing simple carbohydrates to help decrease the risk of diabetes. She was given a new prescription for metformin 500 mg QAM with food #30 with 0 refills.  Vitamin D deficiency. Low Vitamin D level contributes to fatigue and are associated with obesity, breast, and colon cancer. She agrees to start Vitamin D this week and will follow-up for routine testing of Vitamin D, at least 2-3 times  per year to avoid over-replacement.  At risk for diabetes mellitus. Adelie was given approximately 15 minutes of diabetes education and counseling today. We discussed intensive lifestyle modifications today with an emphasis on weight loss as well as increasing exercise and decreasing simple carbohydrates in her diet. We also reviewed medication options with an emphasis on risk versus benefit of those discussed.   Repetitive spaced learning was employed today to elicit superior memory formation and behavioral change.  Class 3 severe obesity with serious comorbidity and body mass index (BMI) of 40.0 to 44.9 in adult, unspecified obesity type (HCC).  Norie is currently in the action stage of change. As such, her goal is to continue with weight loss efforts. She has agreed to the Category 2 Plan + 100 calories and journal 400-600 calories and 35-40 grams of protein.   Exercise goals: No exercise has been prescribed at this time.  Behavioral modification strategies: increasing lean protein intake, decreasing simple carbohydrates and keeping a strict food journal.  Shikita has agreed to follow-up with our clinic in 2 weeks. She was informed of the importance of frequent follow-up visits to maximize her success with intensive lifestyle modifications for her multiple health conditions.  on time to discuss these results.  Objective:   Blood pressure 131/88, pulse 85, temperature 97.9 F (36.6 C), temperature source Oral, height 5\' 4"  (1.626 m), weight 243 lb (110.2 kg), SpO2 97 %. Body mass index is 41.71 kg/m.  General: Cooperative, alert, well developed, in no acute distress. HEENT: Conjunctivae and lids unremarkable. Cardiovascular:  Regular rhythm.  Lungs: Normal work of breathing. Neurologic: No focal deficits.   Lab Results  Component Value Date   CREATININE 0.65 01/01/2020   BUN 9 01/01/2020   NA 143 01/01/2020   K 4.3 01/01/2020   CL 106 01/01/2020   CO2 22 01/01/2020   Lab  Results  Component Value Date   ALT 42 (H) 01/01/2020   AST 30 01/01/2020   ALKPHOS 97 01/01/2020   BILITOT 0.4 01/01/2020   Lab Results  Component Value Date   HGBA1C 5.8 (H) 01/01/2020   HGBA1C 5.6 04/29/2019   Lab Results  Component Value Date   INSULIN 24.9 01/01/2020   Lab Results  Component Value Date   TSH 1.62 04/29/2019   Lab Results  Component Value Date   CHOL 158 04/29/2019   HDL 41.20 04/29/2019   LDLCALC 92 04/29/2019   TRIG 128.0 04/29/2019   CHOLHDL 4 04/29/2019   Lab Results  Component Value Date   WBC 12.3 (H) 01/01/2020   HGB 13.3 01/01/2020   HCT 40.1 01/01/2020   MCV 90 01/01/2020   PLT 366 01/01/2020   Lab Results  Component Value Date   IRON 41 (L) 04/21/2010   TIBC 312 04/21/2010   FERRITIN 22 04/21/2010   Attestation Statements:   Reviewed by clinician on day of visit: allergies, medications, problem list, medical history, surgical history, family history, social history, and previous encounter notes.  IMichaelene Song, am acting as Location manager for [employer], FNP   I have reviewed the above documentation for accuracy and completeness, and I agree with the above. -  Georgianne Fick, FNP

## 2020-02-06 ENCOUNTER — Other Ambulatory Visit (INDEPENDENT_AMBULATORY_CARE_PROVIDER_SITE_OTHER): Payer: Self-pay

## 2020-02-06 MED ORDER — METFORMIN HCL 500 MG PO TABS: 500.0000 mg | ORAL_TABLET | Freq: Every day | ORAL | 0 refills | Status: DC

## 2020-02-18 ENCOUNTER — Ambulatory Visit (INDEPENDENT_AMBULATORY_CARE_PROVIDER_SITE_OTHER): Payer: BC Managed Care – PPO | Admitting: Bariatrics

## 2020-02-19 DIAGNOSIS — L820 Inflamed seborrheic keratosis: Secondary | ICD-10-CM | POA: Diagnosis not present

## 2020-02-19 DIAGNOSIS — B078 Other viral warts: Secondary | ICD-10-CM | POA: Diagnosis not present

## 2020-02-19 DIAGNOSIS — L910 Hypertrophic scar: Secondary | ICD-10-CM | POA: Diagnosis not present

## 2020-02-19 DIAGNOSIS — L718 Other rosacea: Secondary | ICD-10-CM | POA: Diagnosis not present

## 2020-03-25 ENCOUNTER — Emergency Department (HOSPITAL_COMMUNITY)
Admission: EM | Admit: 2020-03-25 | Discharge: 2020-03-25 | Disposition: A | Discharge status: Home / Self Care | Payer: BC Managed Care – PPO | Attending: Emergency Medicine | Admitting: Emergency Medicine

## 2020-03-25 DIAGNOSIS — R079 Chest pain, unspecified: Secondary | ICD-10-CM | POA: Insufficient documentation

## 2020-03-25 DIAGNOSIS — R1030 Lower abdominal pain, unspecified: Secondary | ICD-10-CM | POA: Diagnosis not present

## 2020-03-25 DIAGNOSIS — Z79899 Other long term (current) drug therapy: Secondary | ICD-10-CM | POA: Diagnosis not present

## 2020-03-25 DIAGNOSIS — J45909 Unspecified asthma, uncomplicated: Secondary | ICD-10-CM | POA: Insufficient documentation

## 2020-03-25 DIAGNOSIS — M79642 Pain in left hand: Secondary | ICD-10-CM | POA: Diagnosis not present

## 2020-03-25 DIAGNOSIS — R410 Disorientation, unspecified: Secondary | ICD-10-CM | POA: Insufficient documentation

## 2020-03-25 DIAGNOSIS — R110 Nausea: Secondary | ICD-10-CM | POA: Diagnosis not present

## 2020-03-25 DIAGNOSIS — M79632 Pain in left forearm: Secondary | ICD-10-CM | POA: Insufficient documentation

## 2020-03-25 DIAGNOSIS — R0789 Other chest pain: Secondary | ICD-10-CM | POA: Diagnosis not present

## 2020-03-25 LAB — URINALYSIS, ROUTINE W REFLEX MICROSCOPIC
Bilirubin Urine: NEGATIVE
Glucose, UA: NEGATIVE mg/dL
Ketones, ur: NEGATIVE mg/dL
Nitrite: NEGATIVE
Protein, ur: 100 mg/dL — AB
Specific Gravity, Urine: 1.028 (ref 1.005–1.030)
pH: 6 (ref 5.0–8.0)

## 2020-03-25 LAB — CBC
HCT: 38.5 % (ref 36.0–46.0)
Hemoglobin: 12.7 g/dL (ref 12.0–15.0)
MCH: 30 pg (ref 26.0–34.0)
MCHC: 33 g/dL (ref 30.0–36.0)
MCV: 91 fL (ref 80.0–100.0)
Platelets: 334 10*3/uL (ref 150–400)
RBC: 4.23 MIL/uL (ref 3.87–5.11)
RDW: 13 % (ref 11.5–15.5)
WBC: 16.5 10*3/uL — ABNORMAL HIGH (ref 4.0–10.5)
nRBC: 0 % (ref 0.0–0.2)

## 2020-03-25 LAB — COMPREHENSIVE METABOLIC PANEL
ALT: 26 U/L (ref 0–44)
AST: 19 U/L (ref 15–41)
Albumin: 3.5 g/dL (ref 3.5–5.0)
Alkaline Phosphatase: 78 U/L (ref 38–126)
Anion gap: 9 (ref 5–15)
BUN: 11 mg/dL (ref 6–20)
CO2: 25 mmol/L (ref 22–32)
Calcium: 8.8 mg/dL — ABNORMAL LOW (ref 8.9–10.3)
Chloride: 105 mmol/L (ref 98–111)
Creatinine, Ser: 0.92 mg/dL (ref 0.44–1.00)
GFR calc Af Amer: >60 mL/min (ref >60)
GFR calc non Af Amer: >60 mL/min (ref >60)
Glucose, Bld: 134 mg/dL — ABNORMAL HIGH (ref 70–99)
Potassium: 4.1 mmol/L (ref 3.5–5.1)
Sodium: 139 mmol/L (ref 135–145)
Total Bilirubin: 0.4 mg/dL (ref 0.3–1.2)
Total Protein: 7 g/dL (ref 6.5–8.1)

## 2020-03-25 LAB — I-STAT BETA HCG BLOOD, ED (MC, WL, AP ONLY): I-stat hCG, quantitative: <5 m[IU]/mL (ref <5)

## 2020-03-25 MED ORDER — IBUPROFEN 800 MG PO TABS
800.0000 mg | ORAL_TABLET | Freq: Once | ORAL | Status: AC
  Administered 2020-03-25: 800 mg via ORAL
  Filled 2020-03-25: qty 1

## 2020-03-25 NOTE — Discharge Instructions (Signed)
You will hurt worse tomorrow this is normal.  Please take Tylenol and ibuprofen for your pain.  Please return if you have shortness of breath worsening confusion persistent vomiting or if your abdominal pain suddenly gets much worse.  Follow-up with your family doctor.
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
## 2020-03-25 NOTE — ED Notes (Signed)
Patient verbalizes understanding of discharge instructions. Opportunity for questioning and answers were provided. Armband removed by staff, pt discharged from ED ambulatory.   

## 2020-03-31 ENCOUNTER — Encounter: Payer: Self-pay | Admitting: Medical

## 2020-03-31 ENCOUNTER — Ambulatory Visit: Payer: BC Managed Care – PPO | Admitting: Medical

## 2020-03-31 ENCOUNTER — Ambulatory Visit (HOSPITAL_BASED_OUTPATIENT_CLINIC_OR_DEPARTMENT_OTHER)
Admission: RE | Admit: 2020-03-31 | Discharge: 2020-03-31 | Disposition: A | Discharge status: Home / Self Care | Payer: BC Managed Care – PPO | Source: Ambulatory Visit | Attending: Medical | Admitting: Medical

## 2020-03-31 ENCOUNTER — Other Ambulatory Visit: Payer: Self-pay

## 2020-03-31 VITALS — BP 132/71 | HR 91 | Resp 18 | Ht 63.0 in | Wt 248.6 lb

## 2020-03-31 DIAGNOSIS — S299XXA Unspecified injury of thorax, initial encounter: Secondary | ICD-10-CM | POA: Diagnosis not present

## 2020-03-31 DIAGNOSIS — S79912A Unspecified injury of left hip, initial encounter: Secondary | ICD-10-CM | POA: Diagnosis not present

## 2020-03-31 DIAGNOSIS — M25559 Pain in unspecified hip: Secondary | ICD-10-CM | POA: Insufficient documentation

## 2020-03-31 DIAGNOSIS — S060X0A Concussion without loss of consciousness, initial encounter: Secondary | ICD-10-CM

## 2020-03-31 DIAGNOSIS — R0781 Pleurodynia: Secondary | ICD-10-CM | POA: Insufficient documentation

## 2020-03-31 DIAGNOSIS — M898X1 Other specified disorders of bone, shoulder: Secondary | ICD-10-CM

## 2020-03-31 DIAGNOSIS — M542 Cervicalgia: Secondary | ICD-10-CM | POA: Diagnosis not present

## 2020-03-31 DIAGNOSIS — M25519 Pain in unspecified shoulder: Secondary | ICD-10-CM

## 2020-03-31 DIAGNOSIS — M25512 Pain in left shoulder: Secondary | ICD-10-CM | POA: Diagnosis not present

## 2020-03-31 DIAGNOSIS — S4992XA Unspecified injury of left shoulder and upper arm, initial encounter: Secondary | ICD-10-CM | POA: Diagnosis not present

## 2020-03-31 MED ORDER — CYCLOBENZAPRINE HCL 10 MG PO TABS: 10.0000 mg | ORAL_TABLET | Freq: Every day | ORAL | 0 refills | Status: DC

## 2020-03-31 MED ORDER — DICLOFENAC SODIUM 75 MG PO TBEC: 75.0000 mg | DELAYED_RELEASE_TABLET | Freq: Two times a day (BID) | ORAL | 0 refills | Status: DC
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
## 2020-04-01 DIAGNOSIS — L910 Hypertrophic scar: Secondary | ICD-10-CM | POA: Diagnosis not present

## 2020-06-29 DIAGNOSIS — L820 Inflamed seborrheic keratosis: Secondary | ICD-10-CM | POA: Diagnosis not present

## 2020-06-29 DIAGNOSIS — L910 Hypertrophic scar: Secondary | ICD-10-CM | POA: Diagnosis not present

## 2020-07-10 ENCOUNTER — Encounter: Payer: Self-pay | Admitting: Medical

## 2020-07-10 ENCOUNTER — Ambulatory Visit: Payer: BC Managed Care – PPO | Admitting: Medical

## 2020-07-10 ENCOUNTER — Other Ambulatory Visit: Payer: Self-pay

## 2020-07-10 VITALS — BP 130/86 | HR 84 | Temp 98.5°F | Resp 16 | Ht 63.0 in | Wt 260.2 lb

## 2020-07-10 DIAGNOSIS — H60502 Unspecified acute noninfective otitis externa, left ear: Secondary | ICD-10-CM | POA: Diagnosis not present

## 2020-07-10 DIAGNOSIS — F419 Anxiety disorder, unspecified: Secondary | ICD-10-CM

## 2020-07-10 DIAGNOSIS — H9201 Otalgia, right ear: Secondary | ICD-10-CM

## 2020-07-10 MED ORDER — AZITHROMYCIN 250 MG PO TABS: ORAL_TABLET | ORAL | 0 refills | Status: DC

## 2020-07-10 MED ORDER — NEOMYCIN-POLYMYXIN-HC 3.5-10000-1 OT SOLN: 3.0000 [drp] | Freq: Four times a day (QID) | OTIC | 0 refills | Status: DC

## 2020-07-10 MED ORDER — CLONAZEPAM 0.5 MG PO TABS: 0.5000 mg | ORAL_TABLET | Freq: Two times a day (BID) | ORAL | 0 refills | Status: DC | PRN

## 2020-07-10 NOTE — Patient Instructions (Addendum)
Appear to have combination of otitis externa with cerumen impaction. Will rx cortisporin otic drops. If pain worsens despite the drops then can add atihromycin oral antibiotic.  Need to recheck ear and determine if may need ear lavage.   For anxiety rx klonopin and continue to use sparingly as you have done in past.   Follow up one week or as needed

## 2020-07-10 NOTE — Progress Notes (Signed)
Pre visit review using our clinic review tool, if applicable. No additional management support is needed unless otherwise documented below in the visit note. 

## 2020-07-10 NOTE — Progress Notes (Signed)
Subjective:    Patient ID: Valerie Burke, female    DOB: [birth year], 44 y.o.   MRN: [id number]  HPI  Pt in with rt ear pain since last week.  Pt states hx of intermittent ear itching for years and since spring her ear has felt clogged. Intermittently  feels like hearing is decreased.   Pt states her daughter has hx of swimmers ear. So mom states has at home otoscope. Looked like black brown material in ear.  Pt states she did over the candle ear candle. Next day ear was very tender.   No fever chills or sweats.   Review of Systems  Constitutional: Negative for chills, fatigue and fever.  HENT: Positive for ear pain. Negative for congestion.   Respiratory: Negative for cough, chest tightness, shortness of breath and wheezing.   Cardiovascular: Negative for chest pain and palpitations.  Gastrointestinal: Negative for abdominal pain.  Musculoskeletal: Negative for back pain.  Skin: Negative for rash.  Neurological: Negative for dizziness, syncope, weakness and light-headedness.  Hematological: Negative for adenopathy. Does not bruise/bleed easily.  Psychiatric/Behavioral: Negative for behavioral problems, confusion and suicidal ideas. The patient is nervous/anxious.        Hx of anxiety. REcent worse with home schooling and family. Prior use klonopin.    Past Medical History:  Diagnosis Date  . Allergy   . Anemia   . Anxiety   . Asthma   . GERD (gastroesophageal reflux disease)   . OSA (obstructive sleep apnea) 02/03/2017     Social History   Socioeconomic History  . Marital status: Married    Spouse name: Acupuncturist  . Number of children: 3  . Years of education: Not on file  . Highest education level: Not on file  Occupational History  . Occupation: part time clerical; stay at home   Tobacco Use  . Smoking status: Never Smoker  . Smokeless tobacco: Never Used  Vaping Use  . Vaping Use: Never used  Substance and Sexual Activity  . Alcohol use: Yes    Comment: 2-3  glasses of wine  . Drug use: No  . Sexual activity: Never    Birth control/protection: I.U.D.  Other Topics Concern  . Not on file  Social History Narrative  . Not on file   Social Determinants of Health   Financial Resource Strain:   . Difficulty of Paying Living Expenses: Not on file  Food Insecurity:   . Worried About Programme researcher, broadcasting/film/video in the Last Year: Not on file  . Ran Out of Food in the Last Year: Not on file  Transportation Needs:   . Lack of Transportation (Medical): Not on file  . Lack of Transportation (Non-Medical): Not on file  Physical Activity:   . Days of Exercise per Week: Not on file  . Minutes of Exercise per Session: Not on file  Stress:   . Feeling of Stress : Not on file  Social Connections:   . Frequency of Communication with Friends and Family: Not on file  . Frequency of Social Gatherings with Friends and Family: Not on file  . Attends Religious Services: Not on file  . Active Member of Clubs or Organizations: Not on file  . Attends Banker Meetings: Not on file  . Marital Status: Not on file  Intimate Partner Violence:   . Fear of Current or Ex-Partner: Not on file  . Emotionally Abused: Not on file  . Physically Abused: Not on file  .  Sexually Abused: Not on file    Past Surgical History:  Procedure Laterality Date  . WISDOM TOOTH EXTRACTION     age 62    Family History  Problem Relation Age of Onset  . Asthma Mother   . Sleep apnea Mother   . Obesity Mother   . Hypertension Father   . Obesity Father   . Hypertension Paternal Uncle   . Heart disease Maternal Grandmother   . Asthma Brother     Allergies  Allergen Reactions  . Levofloxacin Rash  . Tramadol Nausea Only and Other (See Comments)    Tingling in feet, headache, body temp change from hot to cold.    Current Outpatient Medications on File Prior to Visit  Medication Sig Dispense Refill  . cetirizine (ZYRTEC) 10 MG tablet Take 10 mg by mouth daily.    Marland Kitchen  levonorgestrel (MIRENA, 52 MG,) 20 MCG/24HR IUD Mirena 20 mcg/24 hours (5 yrs) 52 mg intrauterine device    . clonazePAM (KLONOPIN) 0.5 MG tablet Take 1 tablet (0.5 mg total) by mouth 2 (two) times daily as needed for anxiety. (Patient not taking: Reported on 03/31/2020) 14 tablet 0  . cyclobenzaprine (FLEXERIL) 10 MG tablet Take 1 tablet (10 mg total) by mouth at bedtime. (Patient not taking: Reported on 07/10/2020) 7 tablet 0  . diclofenac (VOLTAREN) 75 MG EC tablet Take 1 tablet (75 mg total) by mouth 2 (two) times daily. (Patient not taking: Reported on 07/10/2020) 20 tablet 0  . metFORMIN (GLUCOPHAGE) 500 MG tablet Take 1 tablet (500 mg total) by mouth daily with breakfast. Insurance requires 90 day supply (Patient not taking: Reported on 07/10/2020) 90 tablet 0  . Vitamin D, Ergocalciferol, (DRISDOL) 1.25 MG (50000 UNIT) CAPS capsule Take 1 capsule (50,000 Units total) by mouth every 7 (seven) days. (Patient not taking: Reported on 07/10/2020) 4 capsule 0   No current facility-administered medications on file prior to visit.    BP 130/86 (BP Location: Right Arm, Patient Position: Sitting, Cuff Size: Normal)   Pulse 84   Temp 98.5 F (36.9 C) (Oral)   Resp 16   Ht 5\' 3"  (1.6 m)   Wt 260 lb 4 oz (118 kg)   SpO2 99%   BMI 46.10 kg/m       Objective:   Physical Exam   General  Mental Status - Alert. General Appearance - Well groomed. Not in acute distress.  Skin Rashes- No Rashes.  HEENT Head- Normal. Ear Auditory Canal - Left- Normal. Right - canal swollen, tragal tenderness mild tender. Small amount of wax seen that was deep.Tympanic Membrane- Left- Normal. Right- Not seen due to wax. Eye Sclera/Conjunctiva- Left- Normal. Right- Normal. Nose & Sinuses Nasal Mucosa- Left- Not  Boggy and Congested. Right- Not   Boggy and  Congested.Bilateral no maxillary and no  frontal sinus pressure. Mouth & Throat Lips: Upper Lip- Normal: no dryness, cracking, pallor, cyanosis, or vesicular  eruption. Lower Lip-Normal: no dryness, cracking, pallor, cyanosis or vesicular eruption. Buccal Mucosa- Bilateral- No Aphthous ulcers. Oropharynx- No Discharge or Erythema. Tonsils: Characteristics- Bilateral- No Erythema or Congestion. Size/Enlargement- Bilateral- No enlargement. Discharge- bilateral-None.  Neck Neck- Supple. No Masses.   Chest and Lung Exam Auscultation: Breath Sounds:-Clear even and unlabored.  Cardiovascular Auscultation:Rythm- Regular, rate and rhythm. Murmurs & Other Heart Sounds:Ausculatation of the heart reveal- No Murmurs.  Lymphatic Head & Neck General Head & Neck Lymphatics: Bilateral: Description- No Localized lymphadenopathy.        Assessment & Plan:  Appear to have combination of otitis externa with cerumen impaction. Will rx cortisporin otic drops. If pain worsens despite the drops then can add atihromycin oral antibiotic.  Need to recheck ear and determine if may need ear lavage.   For anxiety rx klonopin and continue to use sparingly as you have done in past.   Follow up one week or as needed  Esperanza Richters, PA-C   Time spent with patient today was 22 minutes which consisted of chart review, discussing diagnosis, work up, treatment and documentation.

## 2020-08-28 DIAGNOSIS — G8929 Other chronic pain: Secondary | ICD-10-CM | POA: Diagnosis not present

## 2020-08-28 DIAGNOSIS — M25561 Pain in right knee: Secondary | ICD-10-CM | POA: Diagnosis not present

## 2020-08-28 DIAGNOSIS — M25361 Other instability, right knee: Secondary | ICD-10-CM | POA: Diagnosis not present

## 2020-08-28 DIAGNOSIS — M357 Hypermobility syndrome: Secondary | ICD-10-CM | POA: Diagnosis not present

## 2020-08-28 DIAGNOSIS — M1711 Unilateral primary osteoarthritis, right knee: Secondary | ICD-10-CM | POA: Diagnosis not present

## 2020-09-15 DIAGNOSIS — M7661 Achilles tendinitis, right leg: Secondary | ICD-10-CM | POA: Diagnosis not present

## 2020-09-16 DIAGNOSIS — Z7409 Other reduced mobility: Secondary | ICD-10-CM | POA: Diagnosis not present

## 2020-09-16 DIAGNOSIS — G8929 Other chronic pain: Secondary | ICD-10-CM | POA: Diagnosis not present

## 2020-09-16 DIAGNOSIS — M1711 Unilateral primary osteoarthritis, right knee: Secondary | ICD-10-CM | POA: Diagnosis not present

## 2020-09-16 DIAGNOSIS — M25361 Other instability, right knee: Secondary | ICD-10-CM | POA: Diagnosis not present

## 2020-09-16 DIAGNOSIS — M25561 Pain in right knee: Secondary | ICD-10-CM | POA: Diagnosis not present

## 2020-09-16 DIAGNOSIS — M357 Hypermobility syndrome: Secondary | ICD-10-CM | POA: Diagnosis not present

## 2020-09-16 DIAGNOSIS — Z789 Other specified health status: Secondary | ICD-10-CM | POA: Diagnosis not present

## 2020-09-16 DIAGNOSIS — R2689 Other abnormalities of gait and mobility: Secondary | ICD-10-CM | POA: Diagnosis not present

## 2020-09-18 DIAGNOSIS — M1711 Unilateral primary osteoarthritis, right knee: Secondary | ICD-10-CM | POA: Diagnosis not present

## 2020-09-18 DIAGNOSIS — Z789 Other specified health status: Secondary | ICD-10-CM | POA: Diagnosis not present

## 2020-09-18 DIAGNOSIS — M357 Hypermobility syndrome: Secondary | ICD-10-CM | POA: Diagnosis not present

## 2020-09-18 DIAGNOSIS — M25361 Other instability, right knee: Secondary | ICD-10-CM | POA: Diagnosis not present

## 2020-09-18 DIAGNOSIS — G8929 Other chronic pain: Secondary | ICD-10-CM | POA: Diagnosis not present

## 2020-09-18 DIAGNOSIS — M25561 Pain in right knee: Secondary | ICD-10-CM | POA: Diagnosis not present

## 2020-09-18 DIAGNOSIS — R2689 Other abnormalities of gait and mobility: Secondary | ICD-10-CM | POA: Diagnosis not present

## 2020-09-18 DIAGNOSIS — Z7409 Other reduced mobility: Secondary | ICD-10-CM | POA: Diagnosis not present

## 2020-09-29 DIAGNOSIS — M1711 Unilateral primary osteoarthritis, right knee: Secondary | ICD-10-CM | POA: Diagnosis not present

## 2020-09-29 DIAGNOSIS — M357 Hypermobility syndrome: Secondary | ICD-10-CM | POA: Diagnosis not present

## 2020-09-29 DIAGNOSIS — M25361 Other instability, right knee: Secondary | ICD-10-CM | POA: Diagnosis not present

## 2020-10-02 DIAGNOSIS — G8929 Other chronic pain: Secondary | ICD-10-CM | POA: Diagnosis not present

## 2020-10-02 DIAGNOSIS — M1711 Unilateral primary osteoarthritis, right knee: Secondary | ICD-10-CM | POA: Diagnosis not present

## 2020-10-02 DIAGNOSIS — M25561 Pain in right knee: Secondary | ICD-10-CM | POA: Diagnosis not present

## 2020-10-02 DIAGNOSIS — R2689 Other abnormalities of gait and mobility: Secondary | ICD-10-CM | POA: Diagnosis not present

## 2020-10-02 DIAGNOSIS — M25361 Other instability, right knee: Secondary | ICD-10-CM | POA: Diagnosis not present

## 2020-10-02 DIAGNOSIS — Z789 Other specified health status: Secondary | ICD-10-CM | POA: Diagnosis not present

## 2020-10-02 DIAGNOSIS — Z7409 Other reduced mobility: Secondary | ICD-10-CM | POA: Diagnosis not present

## 2020-10-02 DIAGNOSIS — M357 Hypermobility syndrome: Secondary | ICD-10-CM | POA: Diagnosis not present

## 2020-11-09 ENCOUNTER — Ambulatory Visit: Payer: BC Managed Care – PPO | Admitting: Medical

## 2020-11-09 ENCOUNTER — Telehealth: Payer: Self-pay

## 2020-11-09 ENCOUNTER — Other Ambulatory Visit: Payer: Self-pay

## 2020-11-09 ENCOUNTER — Other Ambulatory Visit: Payer: Self-pay | Admitting: Medical

## 2020-11-09 VITALS — BP 130/74 | HR 72 | Temp 98.6°F | Resp 18 | Ht 63.0 in | Wt 257.0 lb

## 2020-11-09 DIAGNOSIS — R300 Dysuria: Secondary | ICD-10-CM | POA: Diagnosis not present

## 2020-11-09 DIAGNOSIS — Z7185 Encounter for immunization safety counseling: Secondary | ICD-10-CM

## 2020-11-09 DIAGNOSIS — R350 Frequency of micturition: Secondary | ICD-10-CM | POA: Diagnosis not present

## 2020-11-09 DIAGNOSIS — R059 Cough, unspecified: Secondary | ICD-10-CM | POA: Diagnosis not present

## 2020-11-09 DIAGNOSIS — K219 Gastro-esophageal reflux disease without esophagitis: Secondary | ICD-10-CM

## 2020-11-09 LAB — POC URINALSYSI DIPSTICK (AUTOMATED)
Bilirubin, UA: NEGATIVE
Blood, UA: POSITIVE
Glucose, UA: NEGATIVE
Ketones, UA: NEGATIVE
Leukocytes, UA: NEGATIVE
Nitrite, UA: NEGATIVE
Protein, UA: POSITIVE — AB
Spec Grav, UA: 1.025 (ref 1.010–1.025)
Urobilinogen, UA: 0.2 E.U./dL
pH, UA: 6 (ref 5.0–8.0)

## 2020-11-09 MED ORDER — AMOXICILLIN-POT CLAVULANATE 875-125 MG PO TABS: 1.0000 | ORAL_TABLET | Freq: Two times a day (BID) | ORAL | 0 refills | Status: DC

## 2020-11-09 MED ORDER — FAMOTIDINE 20 MG PO TABS
20.0000 mg | ORAL_TABLET | Freq: Two times a day (BID) | ORAL | 0 refills | Status: DC
Start: 2020-11-09 — End: 2020-11-09

## 2020-11-09 MED ORDER — BENZONATATE 100 MG PO CAPS: 100.0000 mg | ORAL_CAPSULE | Freq: Three times a day (TID) | ORAL | 0 refills | Status: DC | PRN

## 2020-11-09 MED FILL — FAMOTIDINE 20 MG TABS: 20 | 30 days supply | Qty: 60 | Fill #0

## 2020-11-09 MED FILL — BENZONATATE 100 MG CAPS: 100 | 10 days supply | Qty: 30 | Fill #0

## 2020-11-09 MED FILL — AMOX-CLAV 875-125 MG TABLET: 875-125 | 10 days supply | Qty: 20 | Fill #0

## 2020-11-09 NOTE — Telephone Encounter (Signed)
Pt has visit today 12/27 at 1

## 2020-11-09 NOTE — Progress Notes (Signed)
Subjective:    Patient ID: Valerie Burke, female    DOB: 07-25-1976, 44 y.o.   MRN: [id number]  HPI  Pt in for evaluation.  Pt in today reporting urinary symptoms. Pt states had symptoms first week in december and friend gave her keflex. Symptoms responded to keflex. But pt state recent recurrent symptoms since yesterday has below.  Dysuria- yes Frequent urination-yes Hesitancy-no Suprapubic pressure-no Fever-none chills-no Nausea-noe Vomiting-noe CVA pain-none. History of UTI- Gross hematuria- urine looked bloody yesterday. Pt uses mirena.   Pt states since end of November had cough()she took antibiotic and steroid). Pt states cough minimal except when has reflux . She mentioned recently heart burn flared. No allergy signs or symptoms.   Pt friend gave her meds   Pt has not had covid vaccines.     Review of Systems  Constitutional: Negative for chills, fatigue and fever.  HENT: Positive for congestion. Negative for mouth sores, postnasal drip, sinus pressure and sinus pain.   Respiratory: Positive for cough. Negative for chest tightness, shortness of breath and wheezing.   Cardiovascular: Negative for chest pain and palpitations.  Gastrointestinal: Negative for abdominal pain, constipation, nausea and vomiting.  Genitourinary: Positive for dysuria and frequency. Negative for difficulty urinating and pelvic pain.  Musculoskeletal: Negative for back pain, joint swelling, myalgias and neck stiffness.  Skin: Negative for rash.  Neurological: Negative for dizziness, facial asymmetry, speech difficulty, weakness, light-headedness and numbness.  Hematological: Negative for adenopathy. Does not bruise/bleed easily.  Psychiatric/Behavioral: Negative for behavioral problems and confusion.    Past Medical History:  Diagnosis Date   Allergy    Anemia    Anxiety    Asthma    GERD (gastroesophageal reflux disease)    OSA (obstructive sleep apnea) 02/03/2017      Social History   Socioeconomic History   Marital status: Married    Spouse name: Acupuncturist   Number of children: 3   Years of education: Not on file   Highest education level: Not on file  Occupational History   Occupation: part time clerical; stay at home   Tobacco Use   Smoking status: Never Smoker   Smokeless tobacco: Never Used  Building services engineer Use: Never used  Substance and Sexual Activity   Alcohol use: Yes    Comment: 2-3 glasses of wine   Drug use: No   Sexual activity: Never    Birth control/protection: I.U.D.  Other Topics Concern   Not on file  Social History Narrative   Not on file   Social Determinants of Health   Financial Resource Strain: Not on file  Food Insecurity: Not on file  Transportation Needs: Not on file  Physical Activity: Not on file  Stress: Not on file  Social Connections: Not on file  Intimate Partner Violence: Not on file    Past Surgical History:  Procedure Laterality Date   WISDOM TOOTH EXTRACTION     age 49    Family History  Problem Relation Age of Onset   Asthma Mother    Sleep apnea Mother    Obesity Mother    Hypertension Father    Obesity Father    Hypertension Paternal Uncle    Heart disease Maternal Grandmother    Asthma Brother     Allergies  Allergen Reactions   Levofloxacin Rash   Tramadol Nausea Only and Other (See Comments)    Tingling in feet, headache, body temp change from hot to cold.    Current  Outpatient Medications on File Prior to Visit  Medication Sig Dispense Refill   clonazePAM (KLONOPIN) 0.5 MG tablet Take 1 tablet (0.5 mg total) by mouth 2 (two) times daily as needed for anxiety. 14 tablet 0   levonorgestrel (MIRENA) 20 MCG/24HR IUD Mirena 20 mcg/24 hours (5 yrs) 52 mg intrauterine device     azithromycin (ZITHROMAX) 250 MG tablet Take 2 tablets by mouth on day 1, followed by 1 tablet by mouth daily for 4 days. (Patient not taking: Reported on 11/09/2020) 6 tablet  0   cetirizine (ZYRTEC) 10 MG tablet Take 10 mg by mouth daily. (Patient not taking: Reported on 11/09/2020)     clonazePAM (KLONOPIN) 0.5 MG tablet Take 1 tablet (0.5 mg total) by mouth 2 (two) times daily as needed for anxiety. (Patient not taking: No sig reported) 14 tablet 0   cyclobenzaprine (FLEXERIL) 10 MG tablet Take 1 tablet (10 mg total) by mouth at bedtime. (Patient not taking: No sig reported) 7 tablet 0   diclofenac (VOLTAREN) 75 MG EC tablet Take 1 tablet (75 mg total) by mouth 2 (two) times daily. (Patient not taking: No sig reported) 20 tablet 0   metFORMIN (GLUCOPHAGE) 500 MG tablet Take 1 tablet (500 mg total) by mouth daily with breakfast. Insurance requires 90 day supply (Patient not taking: No sig reported) 90 tablet 0   neomycin-polymyxin-hydrocortisone (CORTISPORIN) OTIC solution Place 3 drops into the right ear 4 (four) times daily. (Patient not taking: Reported on 11/09/2020) 10 mL 0   Vitamin D, Ergocalciferol, (DRISDOL) 1.25 MG (50000 UNIT) CAPS capsule Take 1 capsule (50,000 Units total) by mouth every 7 (seven) days. (Patient not taking: No sig reported) 4 capsule 0   No current facility-administered medications on file prior to visit.    BP 130/74    Pulse 72    Temp 98.6 F (37 C) (Oral)    Resp 18    Ht 5\' 3"  (1.6 m)    Wt 257 lb (116.6 kg)    SpO2 100%    BMI 45.53 kg/m     Objective:   Physical Exam  General Mental Status- Alert. General Appearance- Not in acute distress.   Skin General: Color- Normal Color. Moisture- Normal Moisture.  Neck Carotid Arteries- Normal color. Moisture- Normal Moisture. No carotid bruits. No JVD.  Chest and Lung Exam Auscultation: Breath Sounds:-Normal.  Cardiovascular Auscultation:Rythm- Regular. Murmurs & Other Heart Sounds:Auscultation of the heart reveals- No Murmurs.  Abdomen Inspection:-Inspeection Normal. Palpation/Percussion:Note:No mass. Palpation and Percussion of the abdomen reveal- suprapubic  Tender, Non Distended + BS, no rebound or guarding.   Back- no cva tenderness.  Neurologic Cranial Nerve exam:- CN III-XII intact(No nystagmus), symmetric smile. Strength:- 5/5 equal and symmetric strength both upper and lower extremities.      Assessment & Plan:  You appear to have a urinary tract infection. I am prescribing  augmentin antibiotic for the probable infection. Hydrate well. I am sending out a urine culture. During the interim if your signs and symptoms worsen rather than improving please notify . We will notify your when the culture results are back.  For recent reflux which appears to be causing dry cough will rx famotadine and benzonatate. Consider getting tested for covid if your symptoms worsen such as nasal congestion, body aches, fever and fatigue.  Please consider getting vaccinated against covid.  Follow up in 7 days or as needed.  [place], PA-C

## 2020-11-09 NOTE — Patient Instructions (Addendum)
You appear to have a urinary tract infection. I am prescribing  augmentin antibiotic for the probable infection. Hydrate well. I am sending out a urine culture. During the interim if your signs and symptoms worsen rather than improving please notify us. We will notify your when the culture results are back.  For recent reflux which appears to be causing dry cough will rx famotadine and benzonatate. Consider getting tested for covid if your symptoms worsen such as nasal congestion, body aches, fever and fatigue.  Please consider getting vaccinated against covid.  Follow up in 7 days or as needed.

## 2020-11-09 NOTE — Telephone Encounter (Signed)
Nurse Assessment Nurse: Mayford Knife, RN, Sharyl Nimrod Date/Time Valerie Burke Time): 11/08/2020 12:24:26 PM Confirm and document reason for call. If symptomatic, describe symptoms. ---Caller states she has a bladder infection. Pain and blood when she urinates this am. No fever, pain in adbomen Does the patient have any new or worsening symptoms? ---Yes Will a triage be completed? ---Yes Related visit to physician within the last 2 weeks? ---No Does the PT have any chronic conditions? (i.e. diabetes, asthma, this includes High risk factors for pregnancy, etc.) ---No Is the patient pregnant or possibly pregnant? (Ask all females between the ages of 57-55) ---No Is this a behavioral health or substance abuse call? ---No Guidelines Guideline Title Affirmed Question Affirmed Notes Nurse Date/Time (Eastern Time) Urine - Blood In [1] Pain or burning with passing urine AND [2] side (flank) or back pain present Valerie Burke 11/08/2020 12:25:23 PM Disp. Time Valerie Burke Time) Disposition Final User 11/08/2020 12:31:03 PM See HCP within 4 Hours (or PCP triage) Yes Mayford Knife, RN, Valerie Burke Disagree/Comply Comply PLEASE NOTE: All timestamps contained within this report are represented as [place] Standard Time. CONFIDENTIALTY NOTICE: This fax transmission is intended only for the addressee. It contains information that is legally privileged, confidential or otherwise protected from use or disclosure. If you are not the intended recipient, you are strictly prohibited from reviewing, disclosing, copying using or disseminating any of this information or taking any action in reliance on or regarding this information. If you have received this fax in error, please notify us immediately by telephone so that we can arrange for its return to [place]. Phone: [phone], Toll-Free: [phone], Fax: [phone] Page: 2 of 2 Call Id: [id number] Caller Understands Yes PreDisposition Go to Urgent Care/Walk-In  Clinic Care Advice Given Per Guideline SEE HCP (OR PCP TRIAGE) WITHIN 4 HOURS: * IF OFFICE WILL BE OPEN: You need to be seen within the next 3 or 4 hours. Call your doctor (or NP/PA) now or as soon as the office opens. PAIN MEDICINES: * For pain relief, you can take either acetaminophen, ibuprofen, or naproxen. * They are over-the-counter (OTC) pain drugs. You can buy them at the drugstore. * ACETAMINOPHEN - REGULAR STRENGTH TYLENOL: Take 650 mg (two 325 mg pills) by mouth every 4 to 6 hours as needed. Each Regular Strength Tylenol pill has 325 mg of acetaminophen. The most you should take each day is 3,250 mg (10 pills a day). PAIN MEDICINES - EXTRA NOTES AND WARNINGS: * Use the lowest amount of medicine that makes your pain better. * Acetaminophen is thought to be safer than ibuprofen or naproxen in people over 38 years old. Acetaminophen is in many OTC and prescription medicines. It might be in more than one medicine that you are taking. You need to be careful and not take an overdose. An acetaminophen overdose can hurt the liver. CALL BACK IF: * Fever occurs * You become worse CARE ADVICE given per Urine, Blood In (Adult) guideline. Referrals [facility]

## 2020-11-11 ENCOUNTER — Encounter: Payer: Self-pay | Admitting: Medical

## 2020-11-11 LAB — URINE CULTURE
MICRO NUMBER:: 11358081
SPECIMEN QUALITY:: ADEQUATE

## 2020-11-20 ENCOUNTER — Other Ambulatory Visit (HOSPITAL_BASED_OUTPATIENT_CLINIC_OR_DEPARTMENT_OTHER): Payer: Self-pay | Admitting: Medical

## 2020-11-20 DIAGNOSIS — Z1231 Encounter for screening mammogram for malignant neoplasm of breast: Secondary | ICD-10-CM

## 2020-12-15 DIAGNOSIS — Z6841 Body Mass Index (BMI) 40.0 and over, adult: Secondary | ICD-10-CM | POA: Diagnosis not present

## 2020-12-15 DIAGNOSIS — Z01419 Encounter for gynecological examination (general) (routine) without abnormal findings: Secondary | ICD-10-CM | POA: Diagnosis not present

## 2020-12-15 DIAGNOSIS — Z1231 Encounter for screening mammogram for malignant neoplasm of breast: Secondary | ICD-10-CM | POA: Diagnosis not present

## 2021-03-18 IMAGING — DX DG SHOULDER 2+V*R*
3 series · 3 of 3 positions shown · non-contrast
Comparison: None.

CLINICAL DATA: Right shoulder pain and limited range of motion
since a fall 5 days ago. Initial encounter.

EXAM:
RIGHT SHOULDER - 2+ VIEW

[shoulder grashey]
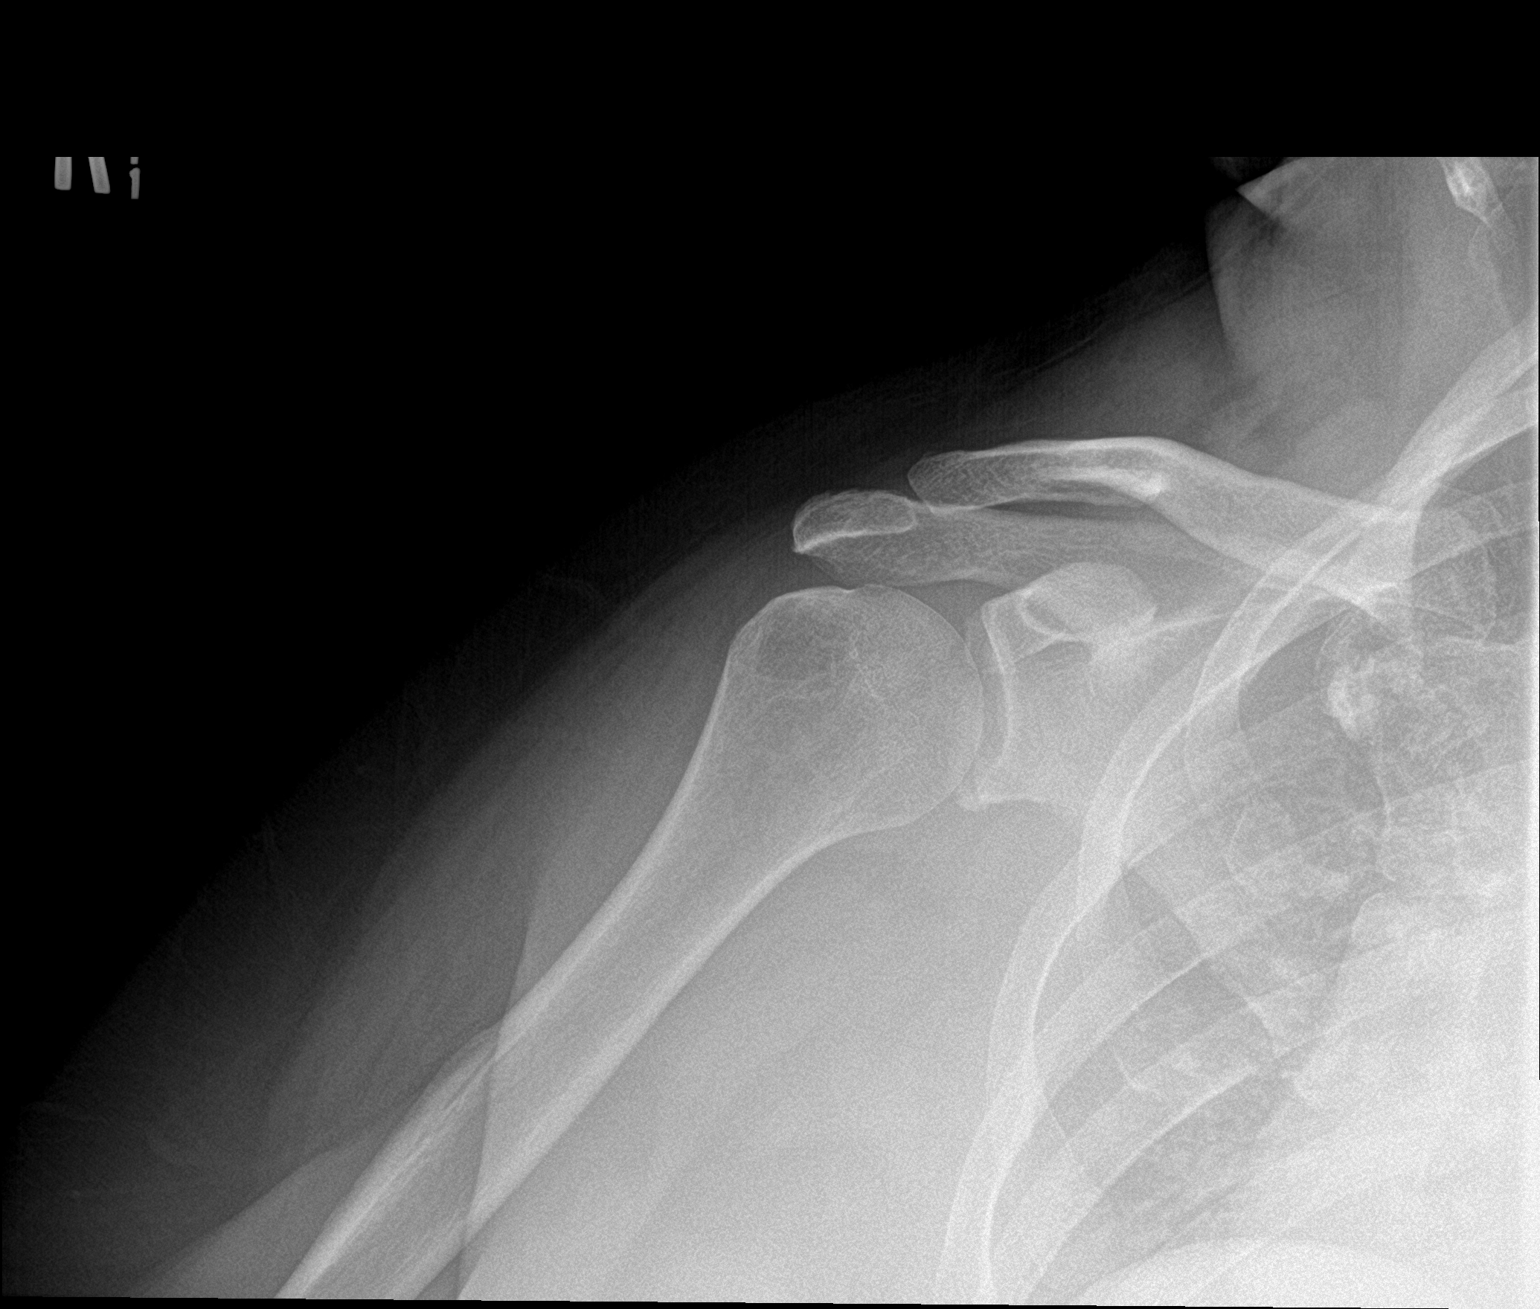

[shoulder y view]
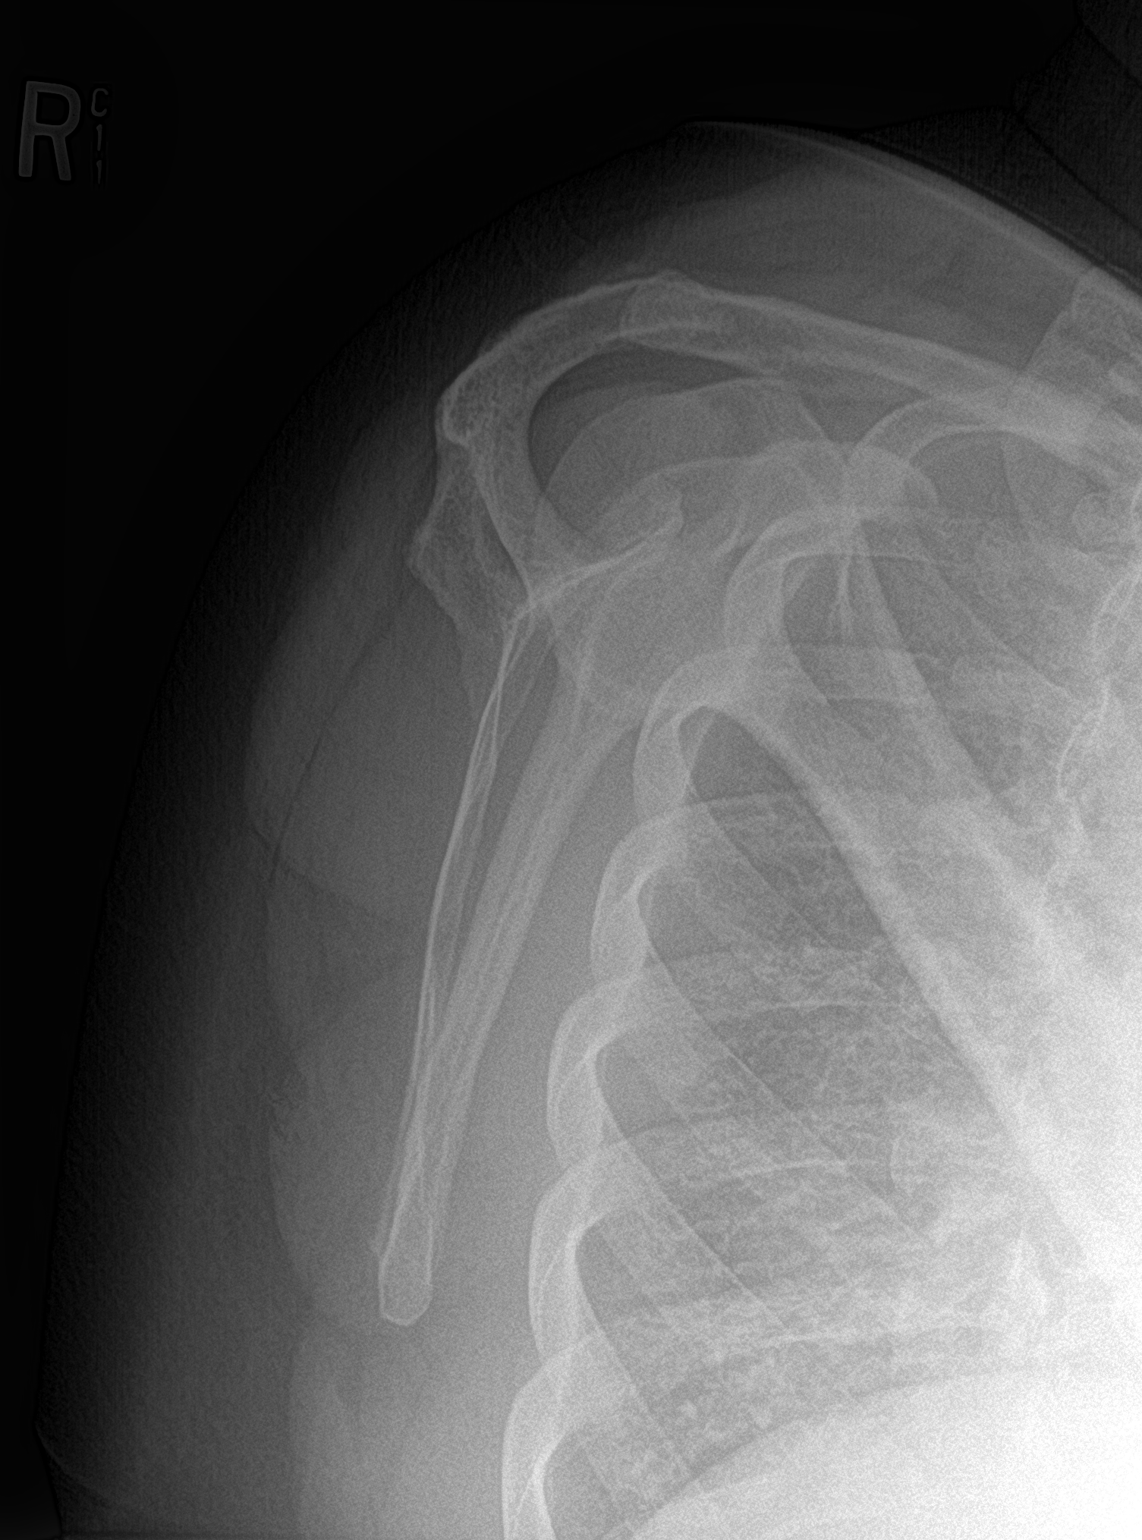

[shoulder axillary]
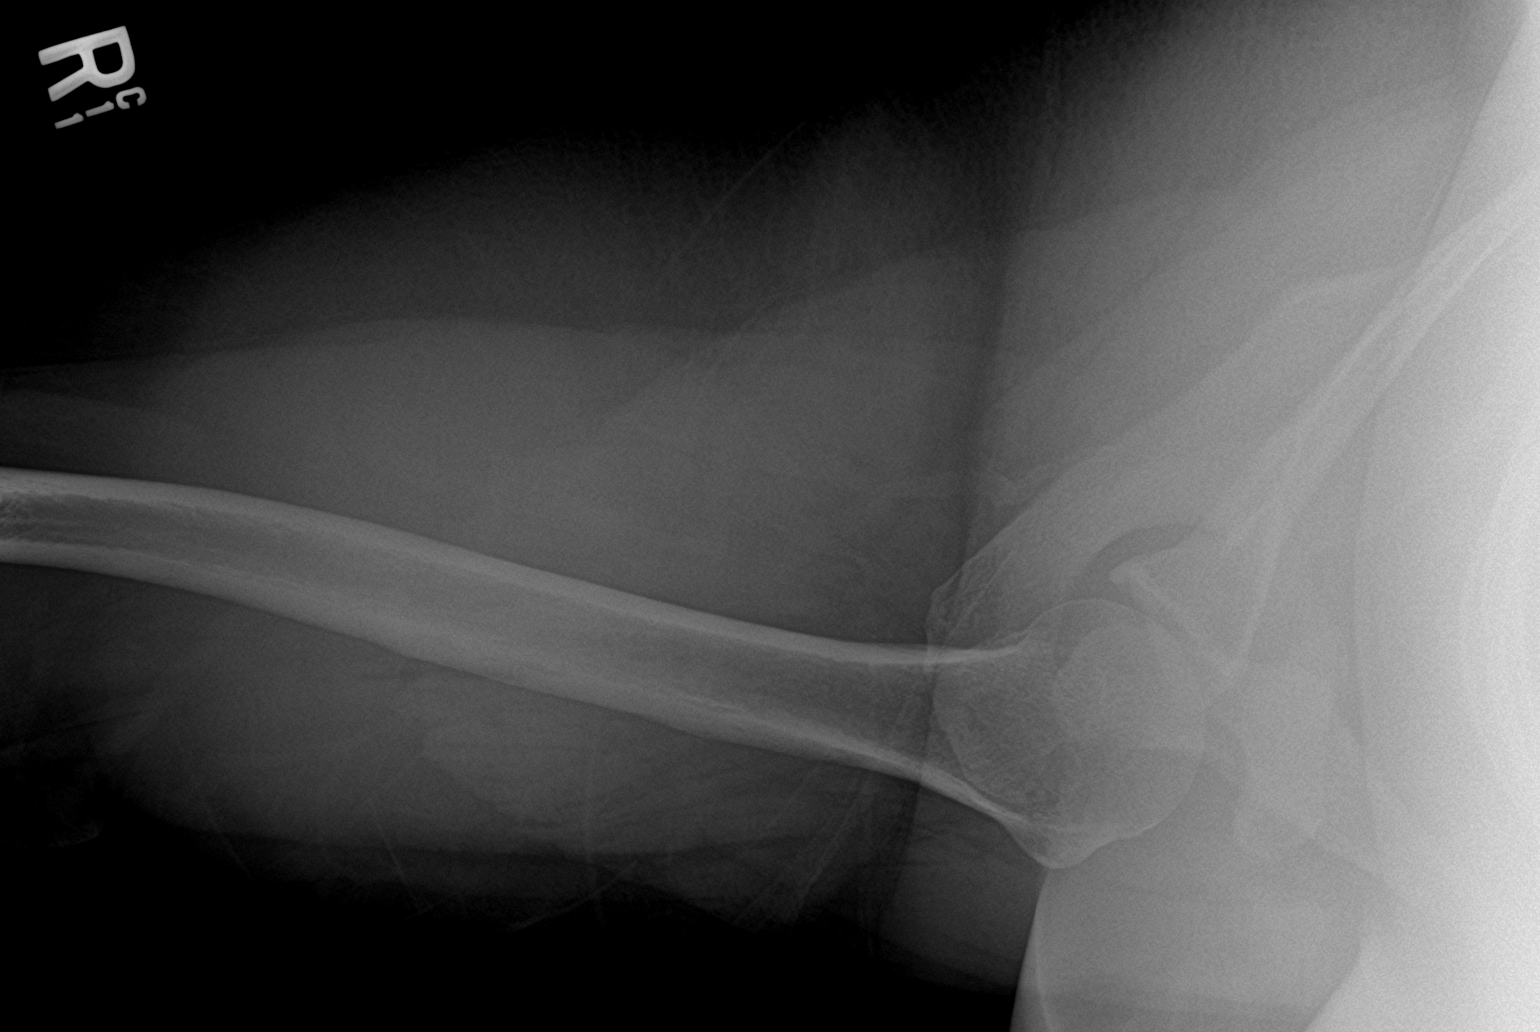

[3 of 3 positions shown; findings below may reference images not displayed]

FINDINGS: There is no evidence of fracture or dislocation. There is no
evidence of arthropathy or other focal bone abnormality. Soft
tissues are unremarkable.
IMPRESSION: Negative exam.

## 2021-04-06 DIAGNOSIS — M1711 Unilateral primary osteoarthritis, right knee: Secondary | ICD-10-CM | POA: Diagnosis not present

## 2021-08-17 DIAGNOSIS — M1711 Unilateral primary osteoarthritis, right knee: Secondary | ICD-10-CM | POA: Diagnosis not present

## 2021-09-01 DIAGNOSIS — K644 Residual hemorrhoidal skin tags: Secondary | ICD-10-CM | POA: Diagnosis not present
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
## 2021-10-30 DIAGNOSIS — J209 Acute bronchitis, unspecified: Secondary | ICD-10-CM | POA: Diagnosis not present

## 2021-10-30 DIAGNOSIS — J019 Acute sinusitis, unspecified: Secondary | ICD-10-CM | POA: Diagnosis not present

## 2021-11-08 DIAGNOSIS — J180 Bronchopneumonia, unspecified organism: Secondary | ICD-10-CM | POA: Diagnosis not present

## 2021-11-15 DIAGNOSIS — J209 Acute bronchitis, unspecified: Secondary | ICD-10-CM | POA: Diagnosis not present

## 2021-11-15 DIAGNOSIS — R030 Elevated blood-pressure reading, without diagnosis of hypertension: Secondary | ICD-10-CM | POA: Diagnosis not present

## 2021-11-19 ENCOUNTER — Other Ambulatory Visit: Payer: Self-pay

## 2021-11-19 ENCOUNTER — Ambulatory Visit (HOSPITAL_BASED_OUTPATIENT_CLINIC_OR_DEPARTMENT_OTHER)
Admission: RE | Admit: 2021-11-19 | Discharge: 2021-11-19 | Disposition: A | Discharge status: Home / Self Care | Payer: BC Managed Care – PPO | Source: Ambulatory Visit | Attending: Medical | Admitting: Medical

## 2021-11-19 ENCOUNTER — Ambulatory Visit: Payer: BC Managed Care – PPO | Admitting: Medical

## 2021-11-19 VITALS — BP 140/70 | HR 99 | Temp 98.3°F | Ht 63.0 in | Wt 270.6 lb

## 2021-11-19 DIAGNOSIS — R0981 Nasal congestion: Secondary | ICD-10-CM

## 2021-11-19 DIAGNOSIS — R739 Hyperglycemia, unspecified: Secondary | ICD-10-CM

## 2021-11-19 DIAGNOSIS — R0683 Snoring: Secondary | ICD-10-CM | POA: Diagnosis not present

## 2021-11-19 DIAGNOSIS — G4730 Sleep apnea, unspecified: Secondary | ICD-10-CM

## 2021-11-19 DIAGNOSIS — Z6841 Body Mass Index (BMI) 40.0 and over, adult: Secondary | ICD-10-CM

## 2021-11-19 DIAGNOSIS — R059 Cough, unspecified: Secondary | ICD-10-CM | POA: Insufficient documentation

## 2021-11-19 DIAGNOSIS — R062 Wheezing: Secondary | ICD-10-CM

## 2021-11-19 DIAGNOSIS — R7989 Other specified abnormal findings of blood chemistry: Secondary | ICD-10-CM

## 2021-11-19 DIAGNOSIS — J189 Pneumonia, unspecified organism: Secondary | ICD-10-CM | POA: Diagnosis not present

## 2021-11-19 DIAGNOSIS — K219 Gastro-esophageal reflux disease without esophagitis: Secondary | ICD-10-CM

## 2021-11-19 LAB — CBC WITH DIFFERENTIAL/PLATELET
Basophils Absolute: 0.1 10*3/uL (ref 0.0–0.1)
Basophils Relative: 0.4 % (ref 0.0–3.0)
Eosinophils Absolute: 0.3 10*3/uL (ref 0.0–0.7)
Eosinophils Relative: 2.1 % (ref 0.0–5.0)
HCT: 39.9 % (ref 36.0–46.0)
Hemoglobin: 13.1 g/dL (ref 12.0–15.0)
Lymphocytes Relative: 20.6 % (ref 12.0–46.0)
Lymphs Abs: 2.9 10*3/uL (ref 0.7–4.0)
MCHC: 32.8 g/dL (ref 30.0–36.0)
MCV: 89.5 fl (ref 78.0–100.0)
Monocytes Absolute: 1.1 10*3/uL — ABNORMAL HIGH (ref 0.1–1.0)
Monocytes Relative: 7.7 % (ref 3.0–12.0)
Neutro Abs: 9.7 10*3/uL — ABNORMAL HIGH (ref 1.4–7.7)
Neutrophils Relative %: 69.2 % (ref 43.0–77.0)
Platelets: 270 10*3/uL (ref 150.0–400.0)
RBC: 4.46 Mil/uL (ref 3.87–5.11)
RDW: 15.1 % (ref 11.5–15.5)
WBC: 14 10*3/uL — ABNORMAL HIGH (ref 4.0–10.5)

## 2021-11-19 LAB — COMPREHENSIVE METABOLIC PANEL
ALT: 26 U/L (ref 0–35)
AST: 13 U/L (ref 0–37)
Albumin: 3.7 g/dL (ref 3.5–5.2)
Alkaline Phosphatase: 58 U/L (ref 39–117)
BUN: 11 mg/dL (ref 6–23)
CO2: 28 mEq/L (ref 19–32)
Calcium: 8.7 mg/dL (ref 8.4–10.5)
Chloride: 103 mEq/L (ref 96–112)
Creatinine, Ser: 0.63 mg/dL (ref 0.40–1.20)
GFR: 106.38 mL/min (ref >60.00)
Glucose, Bld: 113 mg/dL — ABNORMAL HIGH (ref 70–99)
Potassium: 3.8 mEq/L (ref 3.5–5.1)
Sodium: 138 mEq/L (ref 135–145)
Total Bilirubin: 0.6 mg/dL (ref 0.2–1.2)
Total Protein: 6.2 g/dL (ref 6.0–8.3)

## 2021-11-19 LAB — HEMOGLOBIN A1C: Hgb A1c MFr Bld: 6.4 % (ref 4.6–6.5)

## 2021-11-19 MED ORDER — FLUTICASONE PROPIONATE 50 MCG/ACT NA SUSP: 2.0000 | Freq: Every day | NASAL | 1 refills | Status: DC

## 2021-11-19 MED ORDER — HYDROCODONE BIT-HOMATROP MBR 5-1.5 MG/5ML PO SOLN: 5.0000 mL | Freq: Three times a day (TID) | ORAL | 0 refills | Status: DC | PRN

## 2021-11-19 NOTE — Progress Notes (Addendum)
Subjective:    Patient ID: Valerie Burke, female    DOB: 1976/05/01, 46 y.o.   MRN: [id number]  HPI Pt states that over new holiday she got diagnosed with pneumonia.   She states has been sick since week before christmas. Dx on first visit to [facility] bronchitis and sinus infection. She got augmentin and steroid.    She states at end of taper dose steroid started feeling sick again.  2nd visit to UC dx with pneumonia and they gave azithromycin and second round of steroid. In addition albuterol solutin for neb treatment.  Pt states Monday had 3rd visit to UC and no further tx given.  At end of the day she has severe coughing fits.   Pt never tested for covid during illness. Pt has not been vaccinate.  Pt has IUD.  Pt went to [facility] weight loss clinic and states lost 12 pounds. Gained it all back.     Review of Systems  Constitutional:  Negative for chills, fatigue and fever.  HENT:  Negative for congestion and ear discharge.   Eyes:  Negative for pain, redness, itching and visual disturbance.  Respiratory:  Positive for cough and wheezing. Negative for chest tightness and shortness of breath.        Residual wheezing/much better.  Cardiovascular:  Negative for chest pain and palpitations.  Gastrointestinal:  Negative for abdominal distention, abdominal pain, constipation and nausea.       Occasional.   Genitourinary:  Negative for dysuria.  Musculoskeletal:  Negative for back pain and joint swelling.  Neurological:  Negative for facial asymmetry and headaches.  Hematological:  Negative for adenopathy. Does not bruise/bleed easily.  Psychiatric/Behavioral:  Negative for behavioral problems and confusion.     Past Medical History:  Diagnosis Date   Allergy    Anemia    Anxiety    Asthma    GERD (gastroesophageal reflux disease)    OSA (obstructive sleep apnea) 02/03/2017     Social History   Socioeconomic History   Marital status: Married    Spouse name: Event organiser   Number  of children: 3   Years of education: Not on file   Highest education level: Not on file  Occupational History   Occupation: part time clerical; stay at home   Tobacco Use   Smoking status: Never   Smokeless tobacco: Never  Vaping Use   Vaping Use: Never used  Substance and Sexual Activity   Alcohol use: Yes    Comment: 2-3 glasses of wine   Drug use: No   Sexual activity: Never    Birth control/protection: I.U.D.  Other Topics Concern   Not on file  Social History Narrative   Not on file   Social Determinants of Health   Financial Resource Strain: Not on file  Food Insecurity: Not on file  Transportation Needs: Not on file  Physical Activity: Not on file  Stress: Not on file  Social Connections: Not on file  Intimate Partner Violence: Not on file    Past Surgical History:  Procedure Laterality Date   WISDOM TOOTH EXTRACTION     age 72    Family History  Problem Relation Age of Onset   Asthma Mother    Sleep apnea Mother    Obesity Mother    Hypertension Father    Obesity Father    Hypertension Paternal Uncle    Heart disease Maternal Grandmother    Asthma Brother     Allergies  Allergen Reactions  Levofloxacin Rash   Tramadol Nausea Only and Other (See Comments)    Tingling in feet, headache, body temp change from hot to cold.    Current Outpatient Medications on File Prior to Visit  Medication Sig Dispense Refill   azithromycin (ZITHROMAX) 250 MG tablet Take 2 tablets by mouth on day 1, followed by 1 tablet by mouth daily for 4 days. (Patient not taking: Reported on 11/09/2020) 6 tablet 0   cetirizine (ZYRTEC) 10 MG tablet Take 10 mg by mouth daily. (Patient not taking: Reported on 11/09/2020)     clonazePAM (KLONOPIN) 0.5 MG tablet Take 1 tablet (0.5 mg total) by mouth 2 (two) times daily as needed for anxiety. (Patient not taking: No sig reported) 14 tablet 0   clonazePAM (KLONOPIN) 0.5 MG tablet Take 1 tablet (0.5 mg total) by mouth 2 (two) times  daily as needed for anxiety. 14 tablet 0   cyclobenzaprine (FLEXERIL) 10 MG tablet Take 1 tablet (10 mg total) by mouth at bedtime. (Patient not taking: No sig reported) 7 tablet 0   diclofenac (VOLTAREN) 75 MG EC tablet Take 1 tablet (75 mg total) by mouth 2 (two) times daily. (Patient not taking: No sig reported) 20 tablet 0   famotidine (PEPCID) 20 MG tablet TAKE 1 TABLET BY MOUTH TWICE DAILY 60 tablet 0   levonorgestrel (MIRENA) 20 MCG/24HR IUD Mirena 20 mcg/24 hours (5 yrs) 52 mg intrauterine device     metFORMIN (GLUCOPHAGE) 500 MG tablet Take 1 tablet (500 mg total) by mouth daily with breakfast. Insurance requires 90 day supply (Patient not taking: No sig reported) 90 tablet 0   neomycin-polymyxin-hydrocortisone (CORTISPORIN) OTIC solution Place 3 drops into the right ear 4 (four) times daily. (Patient not taking: Reported on 11/09/2020) 10 mL 0   Vitamin D, Ergocalciferol, (DRISDOL) 1.25 MG (50000 UNIT) CAPS capsule Take 1 capsule (50,000 Units total) by mouth every 7 (seven) days. (Patient not taking: No sig reported) 4 capsule 0   No current facility-administered medications on file prior to visit.    BP 140/70    Pulse 99    Temp 98.3 F (36.8 C)    Ht 5\' 3"  (1.6 m)    Wt 270 lb 9.6 oz (122.7 kg)    SpO2 96%    BMI 47.93 kg/m       Objective:   Physical Exam  General Mental Status- Alert. General Appearance- Not in acute distress.   Skin General: Color- Normal Color. Moisture- Normal Moisture.  Neck Base of rt neck. Small possible lipoma vs small lymph node.   Chest and Lung Exam Auscultation: Breath Sounds:-Normal.  Cardiovascular Auscultation:Rythm- Regular. Murmurs & Other Heart Sounds:Auscultation of the heart reveals- No Murmurs.  Abdomen Inspection:-Inspeection Normal. Palpation/Percussion:Note:No mass. Palpation and Percussion of the abdomen reveal- Non Tender, Non Distended + BS, no rebound or guarding.  Neurologic Cranial Nerve exam:- CN III-XII  intact(No nystagmus), symmetric smile. Strength:- 5/5 equal and symmetric strength both upper and lower extremities.       Assessment & Plan:   Patient Instructions  Recent illness over past 2 weeks.  Diagnosed with sinus infection, bronchitis and pneumonia.  3 visits to urgent care with 2 antibiotics given and 2 rounds of steroids.  Nasal congestion and cough lingering.  Minimal wheezing reported at this point.  Recommend getting chest x-ray today.  Prescribing cough medicine Hycodan and Flonase nasal spray.  If you have any wheezing recommend using albuterol neb machine.  We will hold off on any further  prednisone presently.  Sleep apnea and snoring.  I placed referral back to your former pulmonologist who did a sleep study 5 years ago.  Hopefully they can give you prescription for machine without repeat sleep study.  Obesity-struggled with weight loss for years.  Consider prescribing Ozempic.  Mild elevated sugar in the past.  We will get metabolic panel and [id number] today.  Then decide on Ozempic.  Right base of neck small lump that feels like lipoma versus lymph node.  Will get CBC today.  Offered/recommended ultrasound versus referral to a surgeon.  Presently you declined that.  Will watch area closely if you notice it getting larger please let me know and would proceed with work-up.  Follow-up in 2 to 3 weeks or sooner if needed.   Mackie Pai, PA-C

## 2021-11-19 NOTE — Patient Instructions (Addendum)
Recent illness over past 2 weeks.  Diagnosed with sinus infection, bronchitis and pneumonia.  3 visits to urgent care with 2 antibiotics given and 2 rounds of steroids.  Nasal congestion and cough lingering.  Minimal wheezing reported at this point.  Recommend getting chest x-ray today.  Prescribing cough medicine Hycodan and Flonase nasal spray.  If you have any wheezing recommend using albuterol neb machine.  We will hold off on any further prednisone presently.  Sleep apnea and snoring.  I placed referral back to your former pulmonologist who did a sleep study 5 years ago.  Hopefully they can give you prescription for machine without repeat sleep study.  Obesity-struggled with weight loss for years.  Consider prescribing Ozempic.  Mild elevated sugar in the past.  We will get metabolic panel and A1c today.  Then decide on Ozempic.  Right base of neck small lump that feels like lipoma versus lymph node.  Will get CBC today.  Offered/recommended ultrasound versus referral to a surgeon.  Presently you declined that.  Will watch area closely if you notice it getting larger please let me know and would proceed with work-up.  Follow-up in 2 to 3 weeks or sooner if needed.

## 2021-12-12 ENCOUNTER — Other Ambulatory Visit: Payer: Self-pay | Admitting: Medical

## 2021-12-23 DIAGNOSIS — Z01419 Encounter for gynecological examination (general) (routine) without abnormal findings: Secondary | ICD-10-CM | POA: Diagnosis not present

## 2021-12-23 DIAGNOSIS — Z1231 Encounter for screening mammogram for malignant neoplasm of breast: Secondary | ICD-10-CM | POA: Diagnosis not present

## 2021-12-23 DIAGNOSIS — Z6841 Body Mass Index (BMI) 40.0 and over, adult: Secondary | ICD-10-CM | POA: Diagnosis not present

## 2021-12-27 ENCOUNTER — Other Ambulatory Visit: Payer: Self-pay | Admitting: Medical

## 2022-01-10 ENCOUNTER — Institutional Professional Consult (permissible substitution): Payer: BC Managed Care – PPO | Admitting: Primary Care

## 2022-01-26 DIAGNOSIS — E559 Vitamin D deficiency, unspecified: Secondary | ICD-10-CM | POA: Diagnosis not present

## 2022-01-26 DIAGNOSIS — E8881 Metabolic syndrome: Secondary | ICD-10-CM | POA: Diagnosis not present

## 2022-01-26 DIAGNOSIS — E669 Obesity, unspecified: Secondary | ICD-10-CM | POA: Diagnosis not present

## 2022-01-26 DIAGNOSIS — Z79899 Other long term (current) drug therapy: Secondary | ICD-10-CM | POA: Diagnosis not present

## 2022-01-26 DIAGNOSIS — E7800 Pure hypercholesterolemia, unspecified: Secondary | ICD-10-CM | POA: Diagnosis not present

## 2022-01-26 DIAGNOSIS — R5383 Other fatigue: Secondary | ICD-10-CM | POA: Diagnosis not present

## 2022-01-26 DIAGNOSIS — R635 Abnormal weight gain: Secondary | ICD-10-CM | POA: Diagnosis not present

## 2022-01-26 DIAGNOSIS — Z131 Encounter for screening for diabetes mellitus: Secondary | ICD-10-CM | POA: Diagnosis not present

## 2022-01-26 DIAGNOSIS — R0602 Shortness of breath: Secondary | ICD-10-CM | POA: Diagnosis not present

## 2022-01-26 DIAGNOSIS — E349 Endocrine disorder, unspecified: Secondary | ICD-10-CM | POA: Diagnosis not present

## 2022-03-08 DIAGNOSIS — R945 Abnormal results of liver function studies: Secondary | ICD-10-CM | POA: Diagnosis not present

## 2022-03-08 DIAGNOSIS — R635 Abnormal weight gain: Secondary | ICD-10-CM | POA: Diagnosis not present

## 2022-03-08 DIAGNOSIS — E559 Vitamin D deficiency, unspecified: Secondary | ICD-10-CM | POA: Diagnosis not present

## 2022-03-23 ENCOUNTER — Telehealth: Payer: BC Managed Care – PPO | Admitting: Physician Assistant

## 2022-03-23 DIAGNOSIS — B9689 Other specified bacterial agents as the cause of diseases classified elsewhere: Secondary | ICD-10-CM | POA: Diagnosis not present

## 2022-03-23 DIAGNOSIS — J019 Acute sinusitis, unspecified: Secondary | ICD-10-CM | POA: Diagnosis not present

## 2022-03-23 MED ORDER — AMOXICILLIN-POT CLAVULANATE 875-125 MG PO TABS: 1.0000 | ORAL_TABLET | Freq: Two times a day (BID) | ORAL | 0 refills | Status: DC

## 2022-03-23 NOTE — Patient Instructions (Signed)
?[name], thank you for joining Margaretann Loveless, PA-C for today's virtual visit.  While this provider is not your primary care provider (PCP), if your PCP is located in our provider database this encounter information will be shared with them immediately following your visit. ? ?Consent: ?(Patient) Valerie Burke provided verbal consent for this virtual visit at the beginning of the encounter. ? ?Current Medications: ? ?Current Outpatient Medications:  ?  amoxicillin-clavulanate (AUGMENTIN) 875-125 MG tablet, Take 1 tablet by mouth 2 (two) times daily., Disp: 14 tablet, Rfl: 0 ?  azithromycin (ZITHROMAX) 250 MG tablet, Take 2 tablets by mouth on day 1, followed by 1 tablet by mouth daily for 4 days. (Patient not taking: Reported on 11/09/2020), Disp: 6 tablet, Rfl: 0 ?  cetirizine (ZYRTEC) 10 MG tablet, Take 10 mg by mouth daily. (Patient not taking: Reported on 11/09/2020), Disp: , Rfl:  ?  clonazePAM (KLONOPIN) 0.5 MG tablet, Take 1 tablet (0.5 mg total) by mouth 2 (two) times daily as needed for anxiety. (Patient not taking: No sig reported), Disp: 14 tablet, Rfl: 0 ?  clonazePAM (KLONOPIN) 0.5 MG tablet, Take 1 tablet (0.5 mg total) by mouth 2 (two) times daily as needed for anxiety., Disp: 14 tablet, Rfl: 0 ?  cyclobenzaprine (FLEXERIL) 10 MG tablet, Take 1 tablet (10 mg total) by mouth at bedtime. (Patient not taking: No sig reported), Disp: 7 tablet, Rfl: 0 ?  diclofenac (VOLTAREN) 75 MG EC tablet, Take 1 tablet (75 mg total) by mouth 2 (two) times daily. (Patient not taking: No sig reported), Disp: 20 tablet, Rfl: 0 ?  famotidine (PEPCID) 20 MG tablet, TAKE 1 TABLET BY MOUTH TWICE DAILY, Disp: 60 tablet, Rfl: 0 ?  fluticasone (FLONASE) 50 MCG/ACT nasal spray, SPRAY 2 SPRAYS INTO EACH NOSTRIL EVERY DAY, Disp: 48 mL, Rfl: 1 ?  HYDROcodone bit-homatropine (HYCODAN) 5-1.5 MG/5ML syrup, Take 5 mLs by mouth every 8 (eight) hours as needed for cough., Disp: 120 mL, Rfl: 0 ?  levonorgestrel (MIRENA) 20  MCG/24HR IUD, Mirena 20 mcg/24 hours (5 yrs) 52 mg intrauterine device, Disp: , Rfl:  ?  metFORMIN (GLUCOPHAGE) 500 MG tablet, Take 1 tablet (500 mg total) by mouth daily with breakfast. Insurance requires 90 day supply (Patient not taking: No sig reported), Disp: 90 tablet, Rfl: 0 ?  neomycin-polymyxin-hydrocortisone (CORTISPORIN) OTIC solution, Place 3 drops into the right ear 4 (four) times daily. (Patient not taking: Reported on 11/09/2020), Disp: 10 mL, Rfl: 0 ?  Vitamin D, Ergocalciferol, (DRISDOL) 1.25 MG (50000 UNIT) CAPS capsule, Take 1 capsule (50,000 Units total) by mouth every 7 (seven) days. (Patient not taking: No sig reported), Disp: 4 capsule, Rfl: 0  ? ?Medications ordered in this encounter:  ?Meds ordered this encounter  ?Medications  ? amoxicillin-clavulanate (AUGMENTIN) 875-125 MG tablet  ?  Sig: Take 1 tablet by mouth 2 (two) times daily.  ?  Dispense:  14 tablet  ?  Refill:  0  ?  Order Specific Question:   Supervising Provider  ?  Answer:   Eber Hong [[id number]]  ?  ? ?*If you need refills on other medications prior to your next appointment, please contact your pharmacy* ? ?Follow-Up: ?Call back or seek an in-person evaluation if the symptoms worsen or if the condition fails to improve as anticipated. ? ?Other Instructions ?Sinus Infection, Adult ?A sinus infection, also called sinusitis, is inflammation of your sinuses. Sinuses are hollow spaces in the bones around your face. Your sinuses are located: ?Around  your eyes. ?In the middle of your forehead. ?Behind your nose. ?In your cheekbones. ?Mucus normally drains out of your sinuses. When your nasal tissues become inflamed or swollen, mucus can become trapped or blocked. This allows bacteria, viruses, and fungi to grow, which leads to infection. Most infections of the sinuses are caused by a virus. ?A sinus infection can develop quickly. It can last for up to 4 weeks (acute) or for more than 12 weeks (chronic). A sinus infection often  develops after a cold. ?What are the causes? ?This condition is caused by anything that creates swelling in the sinuses or stops mucus from draining. This includes: ?Allergies. ?Asthma. ?Infection from bacteria or viruses. ?Deformities or blockages in your nose or sinuses. ?Abnormal growths in the nose (nasal polyps). ?Pollutants, such as chemicals or irritants in the air. ?Infection from fungi. This is rare. ?What increases the risk? ?You are more likely to develop this condition if you: ?Have a weak body defense system (immune system). ?Do a lot of swimming or diving. ?Overuse nasal sprays. ?Smoke. ?What are the signs or symptoms? ?The main symptoms of this condition are pain and a feeling of pressure around the affected sinuses. Other symptoms include: ?Stuffy nose or congestion that makes it difficult to breathe through your nose. ?Thick yellow or greenish drainage from your nose. ?Tenderness, swelling, and warmth over the affected sinuses. ?A cough that may get worse at night. ?Decreased sense of smell and taste. ?Extra mucus that collects in the throat or the back of the nose (postnasal drip) causing a sore throat or bad breath. ?Tiredness (fatigue). ?Fever. ?How is this diagnosed? ?This condition is diagnosed based on: ?Your symptoms. ?Your medical history. ?A physical exam. ?Tests to find out if your condition is acute or chronic. This may include: ?Checking your nose for nasal polyps. ?Viewing your sinuses using a device that has a light (endoscope). ?Testing for allergies or bacteria. ?Imaging tests, such as an MRI or CT scan. ?In rare cases, a bone biopsy may be done to rule out more serious types of fungal sinus disease. ?How is this treated? ?Treatment for a sinus infection depends on the cause and whether your condition is chronic or acute. ?If caused by a virus, your symptoms should go away on their own within 10 days. You may be given medicines to relieve symptoms. They include: ?Medicines that  shrink swollen nasal passages (decongestants). ?A spray that eases inflammation of the nostrils (topical intranasal corticosteroids). ?Rinses that help get rid of thick mucus in your nose (nasal saline washes). ?Medicines that treat allergies (antihistamines). ?Over-the-counter pain relievers. ?If caused by bacteria, your health care provider may recommend waiting to see if your symptoms improve. Most bacterial infections will get better without antibiotic medicine. You may be given antibiotics if you have: ?A severe infection. ?A weak immune system. ?If caused by narrow nasal passages or nasal polyps, surgery may be needed. ?Follow these instructions at home: ?Medicines ?Take, use, or apply over-the-counter and prescription medicines only as told by your health care provider. These may include nasal sprays. ?If you were prescribed an antibiotic medicine, take it as told by your health care provider. Do not stop taking the antibiotic even if you start to feel better. ?Hydrate and humidify ? ?Drink enough fluid to keep your urine pale yellow. Staying hydrated will help to thin your mucus. ?Use a cool mist humidifier to keep the humidity level in your home above 50%. ?Inhale steam for 10-15 minutes, 3-4 times a day,  or as told by your health care provider. You can do this in the bathroom while a hot shower is running. ?Limit your exposure to cool or dry air. ?Rest ?Rest as much as possible. ?Sleep with your head raised (elevated). ?Make sure you get enough sleep each night. ?General instructions ? ?Apply a warm, moist washcloth to your face 3-4 times a day or as told by your health care provider. This will help with discomfort. ?Use nasal saline washes as often as told by your health care provider. ?Wash your hands often with soap and water to reduce your exposure to germs. If soap and water are not available, use hand sanitizer. ?Do not smoke. Avoid being around people who are smoking (secondhand smoke). ?Keep all  follow-up visits. This is important. ?Contact a health care provider if: ?You have a fever. ?Your symptoms get worse. ?Your symptoms do not improve within 10 days. ?Get help right away if: ?You have a severe heada

## 2022-03-23 NOTE — Progress Notes (Signed)
?Virtual Visit Consent  ? ?[name], you are scheduled for a virtual visit with a [facility] provider today. Just as with appointments in the office, your consent must be obtained to participate. Your consent will be active for this visit and any virtual visit you may have with one of our providers in the next 365 days. If you have a MyChart account, a copy of this consent can be sent to you electronically. ? ?As this is a virtual visit, video technology does not allow for your provider to perform a traditional examination. This may limit your provider's ability to fully assess your condition. If your provider identifies any concerns that need to be evaluated in person or the need to arrange testing (such as labs, EKG, etc.), we will make arrangements to do so. Although advances in technology are sophisticated, we cannot ensure that it will always work on either your end or our end. If the connection with a video visit is poor, the visit may have to be switched to a telephone visit. With either a video or telephone visit, we are not always able to ensure that we have a secure connection. ? ?By engaging in this virtual visit, you consent to the provision of healthcare and authorize for your insurance to be billed (if applicable) for the services provided during this visit. Depending on your insurance coverage, you may receive a charge related to this service. ? ?I need to obtain your verbal consent now. Are you willing to proceed with your visit today? [name] has provided verbal consent on 03/23/2022 for a virtual visit (video or telephone). Margaretann Loveless, PA-C ? ?Date: 03/23/2022 10:08 AM ? ?Virtual Visit via Video Note  ? ?Valerie Burke, connected with  Valerie Burke  ([id number], Mar 11, 1976) on 03/23/22 at 10:00 AM EDT by a video-enabled telemedicine application and verified that I am speaking with the correct person using two identifiers. ? ?Location: ?Patient: Virtual Visit Location  Patient: Home ?Provider: Virtual Visit Location Provider: Home Office ?  ?I discussed the limitations of evaluation and management by telemedicine and the availability of in person appointments. The patient expressed understanding and agreed to proceed.   ? ?History of Present Illness: ?Valerie Burke is a 46 y.o. who identifies as a female who was assigned female at birth, and is being seen today for possible sinus infection. ? ?HPI: Sinusitis ?This is a new problem. The current episode started in the past 7 days. The problem has been gradually worsening since onset. There has been no fever. The pain is moderate. Associated symptoms include congestion, headaches and sinus pressure. Pertinent negatives include no coughing, ear pain, hoarse voice or sore throat. (Teeth pain, post nasal drainage) Past treatments include acetaminophen and oral decongestants (zyrtec, sudafed, steam). The treatment provided no relief.   ? ? ?Problems:  ?Patient Active Problem List  ? Diagnosis Date Noted  ? Elevated ALT measurement 01/06/2020  ? Prediabetes 01/06/2020  ? Vitamin D deficiency 01/06/2020  ? Class 3 severe obesity with serious comorbidity and body mass index (BMI) of 40.0 to 44.9 in adult [facility]) 01/01/2020  ? OSA (obstructive sleep apnea) 02/03/2017  ? Anemia, iron deficiency 04/21/2010  ?  ?Allergies:  ?Allergies  ?Allergen Reactions  ? Levofloxacin Rash  ? Tramadol Nausea Only and Other (See Comments)  ?  Tingling in feet, headache, body temp change from hot to cold.  ? ?Medications:  ?Current Outpatient Medications:  ?  amoxicillin-clavulanate (AUGMENTIN) 875-125 MG  tablet, Take 1 tablet by mouth 2 (two) times daily., Disp: 14 tablet, Rfl: 0 ?  azithromycin (ZITHROMAX) 250 MG tablet, Take 2 tablets by mouth on day 1, followed by 1 tablet by mouth daily for 4 days. (Patient not taking: Reported on 11/09/2020), Disp: 6 tablet, Rfl: 0 ?  cetirizine (ZYRTEC) 10 MG tablet, Take 10 mg by mouth daily. (Patient not taking:  Reported on 11/09/2020), Disp: , Rfl:  ?  clonazePAM (KLONOPIN) 0.5 MG tablet, Take 1 tablet (0.5 mg total) by mouth 2 (two) times daily as needed for anxiety. (Patient not taking: No sig reported), Disp: 14 tablet, Rfl: 0 ?  clonazePAM (KLONOPIN) 0.5 MG tablet, Take 1 tablet (0.5 mg total) by mouth 2 (two) times daily as needed for anxiety., Disp: 14 tablet, Rfl: 0 ?  cyclobenzaprine (FLEXERIL) 10 MG tablet, Take 1 tablet (10 mg total) by mouth at bedtime. (Patient not taking: No sig reported), Disp: 7 tablet, Rfl: 0 ?  diclofenac (VOLTAREN) 75 MG EC tablet, Take 1 tablet (75 mg total) by mouth 2 (two) times daily. (Patient not taking: No sig reported), Disp: 20 tablet, Rfl: 0 ?  famotidine (PEPCID) 20 MG tablet, TAKE 1 TABLET BY MOUTH TWICE DAILY, Disp: 60 tablet, Rfl: 0 ?  fluticasone (FLONASE) 50 MCG/ACT nasal spray, SPRAY 2 SPRAYS INTO EACH NOSTRIL EVERY DAY, Disp: 48 mL, Rfl: 1 ?  HYDROcodone bit-homatropine (HYCODAN) 5-1.5 MG/5ML syrup, Take 5 mLs by mouth every 8 (eight) hours as needed for cough., Disp: 120 mL, Rfl: 0 ?  levonorgestrel (MIRENA) 20 MCG/24HR IUD, Mirena 20 mcg/24 hours (5 yrs) 52 mg intrauterine device, Disp: , Rfl:  ?  metFORMIN (GLUCOPHAGE) 500 MG tablet, Take 1 tablet (500 mg total) by mouth daily with breakfast. Insurance requires 90 day supply (Patient not taking: No sig reported), Disp: 90 tablet, Rfl: 0 ?  neomycin-polymyxin-hydrocortisone (CORTISPORIN) OTIC solution, Place 3 drops into the right ear 4 (four) times daily. (Patient not taking: Reported on 11/09/2020), Disp: 10 mL, Rfl: 0 ?  Vitamin D, Ergocalciferol, (DRISDOL) 1.25 MG (50000 UNIT) CAPS capsule, Take 1 capsule (50,000 Units total) by mouth every 7 (seven) days. (Patient not taking: No sig reported), Disp: 4 capsule, Rfl: 0 ? ?Observations/Objective: ?Patient is well-developed, well-nourished in no acute distress.  ?Resting comfortably ?Head is normocephalic, atraumatic.  ?No labored breathing.  ?Speech is clear and  coherent with logical content.  ?Patient is alert and oriented at baseline.  ? ? ?Assessment and Plan: ?1. Acute bacterial sinusitis ?- amoxicillin-clavulanate (AUGMENTIN) 875-125 MG tablet; Take 1 tablet by mouth 2 (two) times daily.  Dispense: 14 tablet; Refill: 0 ? ?- Worsening symptoms that have not responded to OTC medications.  ?- Will give Augmentin ?- Continue allergy medications.  ?- Steam and humidifier can help ?- Stay well hydrated and get plenty of rest.  ?- Seek in person evaluation if no symptom improvement or if symptoms worsen ? ?Follow Up Instructions: ?I discussed the assessment and treatment plan with the patient. The patient was provided an opportunity to ask questions and all were answered. The patient agreed with the plan and demonstrated an understanding of the instructions.  A copy of instructions were sent to the patient via MyChart unless otherwise noted below.  ? ? ?The patient was advised to call back or seek an in-person evaluation if the symptoms worsen or if the condition fails to improve as anticipated. ? ?Time:  ?I spent 10 minutes with the patient via telehealth technology discussing the above  problems/concerns.   ? ?Margaretann LovelessJennifer M Nakeitha Milligan, PA-C ?

## 2022-06-22 ENCOUNTER — Encounter (INDEPENDENT_AMBULATORY_CARE_PROVIDER_SITE_OTHER): Payer: Self-pay

## 2023-01-04 LAB — HM PAP SMEAR

## 2023-04-28 ENCOUNTER — Ambulatory Visit: Payer: BC Managed Care – PPO | Admitting: Medical

## 2023-04-28 ENCOUNTER — Encounter: Payer: Self-pay | Admitting: Medical

## 2023-04-28 ENCOUNTER — Telehealth: Payer: Self-pay | Admitting: Medical

## 2023-04-28 VITALS — BP 132/80 | HR 96 | Temp 98.1°F | Resp 18 | Ht 63.0 in | Wt 216.0 lb

## 2023-04-28 DIAGNOSIS — D72829 Elevated white blood cell count, unspecified: Secondary | ICD-10-CM | POA: Diagnosis not present

## 2023-04-28 DIAGNOSIS — J40 Bronchitis, not specified as acute or chronic: Secondary | ICD-10-CM

## 2023-04-28 DIAGNOSIS — J342 Deviated nasal septum: Secondary | ICD-10-CM | POA: Diagnosis not present

## 2023-04-28 LAB — CBC WITH DIFFERENTIAL/PLATELET
Absolute Monocytes: 1853 cells/uL — ABNORMAL HIGH (ref 200–950)
Basophils Absolute: 90 cells/uL (ref 0–200)
Basophils Relative: 0.4 %
Eosinophils Absolute: 316 cells/uL (ref 15–500)
Eosinophils Relative: 1.4 %
HCT: 37.2 % (ref 35.0–45.0)
Hemoglobin: 12.5 g/dL (ref 11.7–15.5)
Lymphs Abs: 3797 cells/uL (ref 850–3900)
MCH: 31 pg (ref 27.0–33.0)
MCHC: 33.6 g/dL (ref 32.0–36.0)
MCV: 92.3 fL (ref 80.0–100.0)
MPV: 9.2 fL (ref 7.5–12.5)
Monocytes Relative: 8.2 %
Neutro Abs: 16543 cells/uL — ABNORMAL HIGH (ref 1500–7800)
Neutrophils Relative %: 73.2 %
Platelets: 328 10*3/uL (ref 140–400)
RBC: 4.03 10*6/uL (ref 3.80–5.10)
RDW: 12.5 % (ref 11.0–15.0)
Total Lymphocyte: 16.8 %
WBC: 22.6 10*3/uL — ABNORMAL HIGH (ref 3.8–10.8)

## 2023-04-28 MED ORDER — FLUTICASONE PROPIONATE 50 MCG/ACT NA SUSP: 2.0000 | Freq: Every day | NASAL | 1 refills | Status: DC

## 2023-04-28 MED ORDER — AZITHROMYCIN 250 MG PO TABS: ORAL_TABLET | ORAL | 0 refills | Status: AC

## 2023-04-28 MED ORDER — BENZONATATE 100 MG PO CAPS: 100.0000 mg | ORAL_CAPSULE | Freq: Three times a day (TID) | ORAL | 0 refills | Status: DC | PRN

## 2023-04-28 MED ORDER — BUDESONIDE-FORMOTEROL FUMARATE 160-4.5 MCG/ACT IN AERO: 2.0000 | INHALATION_SPRAY | Freq: Two times a day (BID) | RESPIRATORY_TRACT | 12 refills | Status: DC

## 2023-04-28 MED ORDER — FLUTICASONE-SALMETEROL 100-50 MCG/ACT IN AEPB: 1.0000 | INHALATION_SPRAY | Freq: Two times a day (BID) | RESPIRATORY_TRACT | 0 refills | Status: AC

## 2023-04-28 NOTE — Patient Instructions (Signed)
Sinusitis: Completed a 7-day course of Augmentin with resolution of sinus pressure and purulent nasal discharge. No current sinus tenderness on exam. -Continue supportive care with Mucinex and Sudafed. -Consider Flonase for residual nasal congestion.  Bronchitis: Persistent cough and chest tightness despite a 7-day course of Augmentin, a steroid taper, and use of an albuterol inhaler. Occasional wheezing noted, particularly with exertion. -Order chest x-ray. -Order CBC to assess for ongoing infection. -Start Azithromycin. -Prescribe Benzonatate for cough. -Prescribe Symbicort inhaler.  Deviated Septum: Patient reports difficulty with nasal sprays and saline washes, suggestive of anatomical obstruction. History of previous ENT consultation for possible surgical correction. -Refer to ENT for evaluation and management.

## 2023-04-28 NOTE — Telephone Encounter (Signed)
Symbicort is not covered by plan. Pharmacy recommending to switch to [facility]. Please advise?

## 2023-04-28 NOTE — Progress Notes (Signed)
   Subjective:    Patient ID: Valerie Burke, female    DOB: 01-24-1976, 47 y.o.   MRN: [id number]  HPI  Discussed the use of AI scribe software for clinical note transcription with the patient, who gave verbal consent to proceed.  History of Present Illness   The patient, with a history of sinusitis and bronchitis, presents with persistent symptoms despite recent treatment at a local urgent care. She was prescribed a seven-day course of Augmentin, a steroid taper, and an inhaler, which she completed on Monday.  The initial symptoms included sinus pressure, greenish-yellow mucus, and coughing up mucus. The patient denies toothache, a common symptom she associates with sinusitis. She reports continued coughing and occasional wheezing, with a feeling of chest tightness. The wheezing was more pronounced after exertion and at night.   still with nasal congestion and still coughing up mucus. has concerns for pneumonia.  The patient experienced significant weight gain during the steroid treatment, which she found distressing due to recent efforts to lose weight. She continues to feel weak, particularly in the legs, a symptom she associates with illness.  The patient has been taking Mucinex and Sudafed regularly for symptom relief. She expresses concern about an upcoming trip and the potential for her condition to worsen. She also mentions a possible deviated septum, which has previously been considered for surgical correction.        Review of Systems     Objective:   Physical Exam  Physical Exam   Geneal no acute distress. HEENT: Sinuses not tender, frontal or maxillary. Deviated septum left side. neck- no jvd. lungs-clear even and unlabored. neuro- CN III-XII grossly inact.           Assessment & Plan:   Assessment and Plan    Sinusitis: Completed a 7-day course of Augmentin with resolution of sinus pressure and purulent nasal discharge. No current sinus tenderness on  exam. -Continue supportive care with Mucinex and Sudafed. -Consider Flonase for residual nasal congestion.  Bronchitis: Persistent cough and chest tightness despite a 7-day course of Augmentin, a steroid taper, and use of an albuterol inhaler. Occasional wheezing noted, particularly with exertion. -Order chest x-ray. -Order CBC to assess for ongoing infection. -Start Azithromycin. -Prescribe Benzonatate for cough. -Prescribe Symbicort inhaler.  Deviated Septum: Patient reports difficulty with nasal sprays and saline washes, suggestive of anatomical obstruction. History of previous ENT consultation for possible surgical correction. -Refer to ENT for evaluation and management.     Flonase spray on right side as tolerated.(Clarified with pt not to use sudafed)  Follow up 10-14 day if above signs and symptoms persist or sooner.  Also recommend wellness exam early fall.

## 2023-04-28 NOTE — Addendum Note (Signed)
Addended by: Gwenevere Abbot on: 04/28/2023 05:21 PM   Modules accepted: Orders

## 2023-04-29 NOTE — Addendum Note (Signed)
Addended by: Gwenevere Abbot on: 04/29/2023 07:44 AM   Modules accepted: Orders

## 2023-05-09 ENCOUNTER — Other Ambulatory Visit: Payer: Self-pay | Admitting: Family

## 2023-05-09 DIAGNOSIS — D72829 Elevated white blood cell count, unspecified: Secondary | ICD-10-CM

## 2023-05-10 ENCOUNTER — Inpatient Hospital Stay (HOSPITAL_BASED_OUTPATIENT_CLINIC_OR_DEPARTMENT_OTHER): Payer: BC Managed Care – PPO | Admitting: Family

## 2023-05-10 ENCOUNTER — Inpatient Hospital Stay: Payer: BC Managed Care – PPO | Attending: Hematology & Oncology

## 2023-05-10 ENCOUNTER — Encounter: Payer: Self-pay | Admitting: Family

## 2023-05-10 VITALS — BP 137/97 | HR 86 | Temp 98.0°F | Resp 17 | Wt 212.0 lb

## 2023-05-10 DIAGNOSIS — D72829 Elevated white blood cell count, unspecified: Secondary | ICD-10-CM | POA: Diagnosis present

## 2023-05-10 LAB — CBC WITH DIFFERENTIAL (CANCER CENTER ONLY)
Abs Immature Granulocytes: 0.06 10*3/uL (ref 0.00–0.07)
Basophils Absolute: 0.1 10*3/uL (ref 0.0–0.1)
Basophils Relative: 1 %
Eosinophils Absolute: 0.2 10*3/uL (ref 0.0–0.5)
Eosinophils Relative: 2 %
HCT: 37.9 % (ref 36.0–46.0)
Hemoglobin: 12.9 g/dL (ref 12.0–15.0)
Immature Granulocytes: 1 %
Lymphocytes Relative: 24 %
Lymphs Abs: 2.6 10*3/uL (ref 0.7–4.0)
MCH: 31.1 pg (ref 26.0–34.0)
MCHC: 34 g/dL (ref 30.0–36.0)
MCV: 91.3 fL (ref 80.0–100.0)
Monocytes Absolute: 0.8 10*3/uL (ref 0.1–1.0)
Monocytes Relative: 8 %
Neutro Abs: 7.2 10*3/uL (ref 1.7–7.7)
Neutrophils Relative %: 64 %
Platelet Count: 253 10*3/uL (ref 150–400)
RBC: 4.15 MIL/uL (ref 3.87–5.11)
RDW: 12.7 % (ref 11.5–15.5)
WBC Count: 10.9 10*3/uL — ABNORMAL HIGH (ref 4.0–10.5)
nRBC: 0 % (ref 0.0–0.2)

## 2023-05-10 LAB — CMP (CANCER CENTER ONLY)
ALT: 10 U/L (ref 0–44)
AST: 10 U/L — ABNORMAL LOW (ref 15–41)
Albumin: 3.9 g/dL (ref 3.5–5.0)
Alkaline Phosphatase: 60 U/L (ref 38–126)
Anion gap: 7 (ref 5–15)
BUN: 9 mg/dL (ref 6–20)
CO2: 28 mmol/L (ref 22–32)
Calcium: 9.1 mg/dL (ref 8.9–10.3)
Chloride: 105 mmol/L (ref 98–111)
Creatinine: 0.77 mg/dL (ref 0.44–1.00)
GFR, Estimated: >60 mL/min (ref >60)
Glucose, Bld: 101 mg/dL — ABNORMAL HIGH (ref 70–99)
Potassium: 3.7 mmol/L (ref 3.5–5.1)
Sodium: 140 mmol/L (ref 135–145)
Total Bilirubin: 0.5 mg/dL (ref 0.3–1.2)
Total Protein: 6.5 g/dL (ref 6.5–8.1)

## 2023-05-10 LAB — LACTATE DEHYDROGENASE: LDH: 150 U/L (ref 98–192)

## 2023-05-10 LAB — SAVE SMEAR(SSMR), FOR PROVIDER SLIDE REVIEW

## 2023-05-10 NOTE — Progress Notes (Signed)
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 Patient states that this has been a lifelong thing.  She states that recently when her WBC count was 22 she had a respiratory infection which has since resolved. WBC count today is stable at 10.9. Differential unremarkable.  No anemia, platelets 253.  No issue with frequent or recurrent infections.  No known underlying autoimmune disease such as diabetes or thyroid disease.  No hormone replacement therapy.  No smoking, ETOH or recreational drug use.  No known familial history of elevated WBC count.  No personal history of cancer.  She states that her mammogram is up to date and results have been negative.  Only surgical history was wisdom teeth extraction without any complications.  She has a Mirena in place. No issues with blood loss.  No abnormal bruising, no petechiae.  No fever, chills, n/v, cough, rash, dizziness, SOB, chest pain, palpitations, abdominal pain or changes in bowel or bladder habits at this time.  No swelling, tenderness, numbness or tingling in her extremities.  No falls or syncope.  She is on [facility] and tolerating nicely. She has some mild nausea after each injection.  Appetite is good and she is staying well hydrated throughout the day. Weight is stable at 212 lbs.  She enjoys walking for exercise.  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   ROS: All other 10 point review of systems is negative.   PAST MEDICAL HISTORY:   Past Medical History:  Diagnosis Date   Allergy    Anemia    Anxiety    Asthma    GERD (gastroesophageal reflux disease)     OSA (obstructive sleep apnea) 02/03/2017    ALLERGIES: Allergies  Allergen Reactions   Levofloxacin Rash   Tramadol Nausea Only and Other (See Comments)    Tingling in feet, headache, body temp change from hot to cold.      MEDICATIONS:  Current Outpatient Medications on File Prior to Visit  Medication Sig Dispense Refill   cetirizine (ZYRTEC) 10 MG tablet Take 10 mg by mouth daily.     clobetasol ointment (TEMOVATE) 0.05 % Apply 1 Application topically 2 (two) times daily. As needed     famotidine (PEPCID) 20 MG tablet TAKE 1 TABLET BY MOUTH TWICE DAILY 60 tablet 0   fluticasone (FLONASE) 50 MCG/ACT nasal spray Place 2 sprays into both nostrils daily. 16 g 1   fluticasone-salmeterol (WIXELA INHUB) 100-50 MCG/ACT AEPB Inhale 1 puff into the lungs 2 (two) times daily. 60 each 0   hydrocortisone 2.5 % cream Apply 1 Application topically as directed. As needed     levonorgestrel (MIRENA) 20 MCG/24HR IUD Mirena 20 mcg/24 hours (5 yrs) 52 mg intrauterine device     meloxicam (MOBIC) 15 MG tablet Take 1 tablet by mouth daily. As needed     ondansetron (ZOFRAN-ODT) 8 MG disintegrating tablet Take 8 mg by mouth every 8 (eight) hours as needed.     phentermine (ADIPEX-P) 37.5 MG tablet Take 37.5 mg by mouth every morning.     WEGOVY 2.4 MG/0.75ML SOAJ Inject 2.4 mg  into the skin once a week.     benzonatate (TESSALON) 100 MG capsule Take 1 capsule (100 mg total) by mouth 3 (three) times daily as needed for cough. (Patient not taking: Reported on 05/10/2023) 30 capsule 0   fluticasone (FLONASE) 50 MCG/ACT nasal spray SPRAY 2 SPRAYS INTO EACH NOSTRIL EVERY DAY (Patient not taking: Reported on 05/10/2023) 48 mL 1   No current facility-administered medications on file prior to visit.     PAST SURGICAL HISTORY Past Surgical History:  Procedure Laterality Date   WISDOM TOOTH EXTRACTION     age 37    FAMILY HISTORY: Family History  Problem Relation Age of Onset   Asthma Mother    Sleep  apnea Mother    Obesity Mother    Hypertension Father    Obesity Father    Hypertension Paternal Uncle    Heart disease Maternal Grandmother    Asthma Brother     SOCIAL HISTORY:  reports that she has never smoked. She has never used smokeless tobacco. She reports current alcohol use. She reports that she does not use drugs.  PERFORMANCE STATUS: The patient's performance status is 0 - Asymptomatic  PHYSICAL EXAM: Most Recent Vital Signs: There were no vitals taken for this visit. BP (!) 137/97 (BP Location: Left Arm, Patient Position: Sitting)   Pulse 86   Temp 98 F (36.7 C) (Oral)   Resp 17   Wt 212 lb (96.2 kg)   SpO2 100%   BMI 37.55 kg/m   General Appearance:    Alert, cooperative, no distress, appears stated age  Head:    Normocephalic, without obvious abnormality, atraumatic  Eyes:    PERRL, conjunctiva/corneas clear, EOM's intact, fundi    benign, both eyes        Throat:   Lips, mucosa, and tongue normal; teeth and gums normal  Neck:   Supple, symmetrical, trachea midline, no adenopathy;    thyroid:  no enlargement/tenderness/nodules; no carotid   bruit or JVD  Back:     Symmetric, no curvature, ROM normal, no CVA tenderness  Lungs:     Clear to auscultation bilaterally, respirations unlabored  Chest Wall:    No tenderness or deformity   Heart:    Regular rate and rhythm, S1 and S2 normal, no murmur, rub   or gallop     Abdomen:     Soft, non-tender, bowel sounds active all four quadrants,    no masses, no organomegaly        Extremities:   Extremities normal, atraumatic, no cyanosis or edema  Pulses:   2+ and symmetric all extremities  Skin:   Skin color, texture, turgor normal, no rashes or lesions  Lymph nodes:   Cervical, supraclavicular, and axillary nodes normal  Neurologic:   CNII-XII intact, normal strength, sensation and reflexes    throughout    LABORATORY DATA:  Results for orders placed or performed in visit on 05/10/23 (from the past 48  hour(s))  CBC with Differential (Cancer Center Only)     Status: Abnormal   Collection Time: 05/10/23  8:34 AM  Result Value Ref Range   WBC Count 10.9 (H) 4.0 - 10.5 K/uL   RBC 4.15 3.87 - 5.11 MIL/uL   Hemoglobin 12.9 12.0 - 15.0 g/dL   HCT 57.8 46.9 - 62.9 %   MCV 91.3 80.0 - 100.0 fL   MCH 31.1 26.0 - 34.0 pg   MCHC 34.0 30.0 - 36.0 g/dL   RDW 52.8 41.3 -  15.5 %   Platelet Count 253 150 - 400 K/uL   nRBC 0.0 0.0 - 0.2 %   Neutrophils Relative % 64 %   Neutro Abs 7.2 1.7 - 7.7 K/uL   Lymphocytes Relative 24 %   Lymphs Abs 2.6 0.7 - 4.0 K/uL   Monocytes Relative 8 %   Monocytes Absolute 0.8 0.1 - 1.0 K/uL   Eosinophils Relative 2 %   Eosinophils Absolute 0.2 0.0 - 0.5 K/uL   Basophils Relative 1 %   Basophils Absolute 0.1 0.0 - 0.1 K/uL   Immature Granulocytes 1 %   Abs Immature Granulocytes 0.06 0.00 - 0.07 K/uL    Comment: Performed at [facility] at [facility], [address] [place], [place] [address]  CMP (Cancer Center only)     Status: Abnormal   Collection Time: 05/10/23  8:34 AM  Result Value Ref Range   Sodium 140 135 - 145 mmol/L   Potassium 3.7 3.5 - 5.1 mmol/L   Chloride 105 98 - 111 mmol/L   CO2 28 22 - 32 mmol/L   Glucose, Bld 101 (H) 70 - 99 mg/dL    Comment: Glucose reference range applies only to samples taken after fasting for at least 8 hours.   BUN 9 6 - 20 mg/dL   Creatinine 1.91 4.78 - 1.00 mg/dL   Calcium 9.1 8.9 - 29.5 mg/dL   Total Protein 6.5 6.5 - 8.1 g/dL   Albumin 3.9 3.5 - 5.0 g/dL   AST 10 (L) 15 - 41 U/L   ALT 10 0 - 44 U/L   Alkaline Phosphatase 60 38 - 126 U/L   Total Bilirubin 0.5 0.3 - 1.2 mg/dL   GFR, Estimated >62 >13 mL/min    Comment: (NOTE) Calculated using the CKD-EPI Creatinine Equation (2021)    Anion gap 7 5 - 15    Comment: Performed at [facility] at [facility], [address], [place], [place] [address]  Lactate dehydrogenase (LDH)     Status: None    Collection Time: 05/10/23  8:34 AM  Result Value Ref Range   LDH 150 98 - 192 U/L    Comment: Performed at [facility] at [facility], [address] [place], [place] [address]  Save Smear for Provider Slide Review     Status: None   Collection Time: 05/10/23  8:34 AM  Result Value Ref Range   Smear Review SMEAR STAINED AND AVAILABLE FOR REVIEW     Comment: Performed at [facility] at [facility], [address], [place], [place] [address]      RADIOGRAPHY: No results found.     PATHOLOGY: None  ASSESSMENT/PLAN: Ms. 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  CBC and blood smear reviewed with Dr. Myna Hidalgo. No abnormality or evidence of malignancy noted on either.  No follow-up needed at this time.   All questions were answered. The patient knows to call the clinic with any problems, questions or concerns. We can certainly see her again for any heme/onc issue in the future.   The patient was discussed with Dr. Myna Hidalgo and he is in agreement with the aforementioned.   Eileen Stanford, NP

## 2023-05-22 ENCOUNTER — Other Ambulatory Visit: Payer: Self-pay | Admitting: Medical

## 2023-05-29 ENCOUNTER — Encounter: Payer: Self-pay | Admitting: Medical

## 2023-05-29 MED ORDER — MELOXICAM 15 MG PO TABS: 15.0000 mg | ORAL_TABLET | Freq: Every day | ORAL | 0 refills | Status: DC

## 2023-06-21 ENCOUNTER — Other Ambulatory Visit: Payer: Self-pay | Admitting: Medical

## 2023-07-23 ENCOUNTER — Other Ambulatory Visit: Payer: Self-pay | Admitting: Medical

## 2024-01-25 LAB — HM MAMMOGRAPHY

## 2024-04-22 ENCOUNTER — Ambulatory Visit: Admitting: Medical

## 2024-04-22 ENCOUNTER — Encounter: Payer: Self-pay | Admitting: Medical

## 2024-04-22 VITALS — BP 170/98 | HR 78 | Temp 98.0°F | Resp 18 | Ht 63.0 in | Wt 217.4 lb

## 2024-04-22 DIAGNOSIS — E66813 Obesity, class 3: Secondary | ICD-10-CM | POA: Diagnosis not present

## 2024-04-22 DIAGNOSIS — E559 Vitamin D deficiency, unspecified: Secondary | ICD-10-CM

## 2024-04-22 DIAGNOSIS — R110 Nausea: Secondary | ICD-10-CM | POA: Diagnosis not present

## 2024-04-22 DIAGNOSIS — R7303 Prediabetes: Secondary | ICD-10-CM | POA: Diagnosis not present

## 2024-04-22 DIAGNOSIS — D509 Iron deficiency anemia, unspecified: Secondary | ICD-10-CM

## 2024-04-22 DIAGNOSIS — Z6841 Body Mass Index (BMI) 40.0 and over, adult: Secondary | ICD-10-CM

## 2024-04-22 DIAGNOSIS — G43009 Migraine without aura, not intractable, without status migrainosus: Secondary | ICD-10-CM

## 2024-04-22 DIAGNOSIS — I10 Essential (primary) hypertension: Secondary | ICD-10-CM

## 2024-04-22 DIAGNOSIS — F419 Anxiety disorder, unspecified: Secondary | ICD-10-CM

## 2024-04-22 LAB — COMPREHENSIVE METABOLIC PANEL WITH GFR
ALT: 10 U/L (ref 0–35)
AST: 12 U/L (ref 0–37)
Albumin: 4.2 g/dL (ref 3.5–5.2)
Alkaline Phosphatase: 78 U/L (ref 39–117)
BUN: 10 mg/dL (ref 6–23)
CO2: 29 meq/L (ref 19–32)
Calcium: 8.7 mg/dL (ref 8.4–10.5)
Chloride: 103 meq/L (ref 96–112)
Creatinine, Ser: 0.63 mg/dL (ref 0.40–1.20)
GFR: 104.59 mL/min (ref >60.00)
Glucose, Bld: 86 mg/dL (ref 70–99)
Potassium: 3.9 meq/L (ref 3.5–5.1)
Sodium: 140 meq/L (ref 135–145)
Total Bilirubin: 0.6 mg/dL (ref 0.2–1.2)
Total Protein: 7 g/dL (ref 6.0–8.3)

## 2024-04-22 LAB — CBC WITH DIFFERENTIAL/PLATELET
Basophils Absolute: 0 10*3/uL (ref 0.0–0.1)
Basophils Relative: 0.3 % (ref 0.0–3.0)
Eosinophils Absolute: 0.2 10*3/uL (ref 0.0–0.7)
Eosinophils Relative: 1.4 % (ref 0.0–5.0)
HCT: 39.8 % (ref 36.0–46.0)
Hemoglobin: 13.3 g/dL (ref 12.0–15.0)
Lymphocytes Relative: 24.1 % (ref 12.0–46.0)
Lymphs Abs: 2.9 10*3/uL (ref 0.7–4.0)
MCHC: 33.5 g/dL (ref 30.0–36.0)
MCV: 90 fl (ref 78.0–100.0)
Monocytes Absolute: 0.8 10*3/uL (ref 0.1–1.0)
Monocytes Relative: 7 % (ref 3.0–12.0)
Neutro Abs: 8 10*3/uL — ABNORMAL HIGH (ref 1.4–7.7)
Neutrophils Relative %: 67.2 % (ref 43.0–77.0)
Platelets: 325 10*3/uL (ref 150.0–400.0)
RBC: 4.42 Mil/uL (ref 3.87–5.11)
RDW: 13.9 % (ref 11.5–15.5)
WBC: 12 10*3/uL — ABNORMAL HIGH (ref 4.0–10.5)

## 2024-04-22 LAB — LIPID PANEL
Cholesterol: 178 mg/dL (ref 0–200)
HDL: 50.9 mg/dL (ref >39.00)
LDL Cholesterol: 109 mg/dL — ABNORMAL HIGH (ref 0–99)
NonHDL: 126.64
Total CHOL/HDL Ratio: 3
Triglycerides: 88 mg/dL (ref 0.0–149.0)
VLDL: 17.6 mg/dL (ref 0.0–40.0)

## 2024-04-22 LAB — LIPASE: Lipase: 31 U/L (ref 11.0–59.0)

## 2024-04-22 LAB — HEMOGLOBIN A1C: Hgb A1c MFr Bld: 4.9 % (ref 4.6–6.5)

## 2024-04-22 MED ORDER — BUSPIRONE HCL 7.5 MG PO TABS: 7.5000 mg | ORAL_TABLET | Freq: Two times a day (BID) | ORAL | 0 refills | Status: DC

## 2024-04-22 MED ORDER — VALSARTAN 160 MG PO TABS: 160.0000 mg | ORAL_TABLET | Freq: Every day | ORAL | 0 refills | Status: DC

## 2024-04-22 MED ORDER — METOPROLOL SUCCINATE ER 25 MG PO TB24: 25.0000 mg | ORAL_TABLET | Freq: Every day | ORAL | 0 refills | Status: DC

## 2024-04-22 NOTE — Progress Notes (Signed)
 Subjective:    Patient ID: Valerie Burke, female    DOB: September 10, 1976, 48 y.o.   MRN: [id number]  HPI  Valerie Burke is a 48 year old female with hypertension and migraines who presents with elevated blood pressure and migraine headaches.  Over the past month, she has experienced elevated blood pressure, with readings reaching as high as 180/116 mmHg. She discontinued phentermine two weeks ago, suspecting it contributed to her high blood pressure. Previously, her blood pressure was well-controlled.  She has a history of migraines, which have worsened over the past month, occurring at least once a week. These migraines are associated with nausea, vomiting, photophobia, and phonophobia. The pain is bilateral, affecting her temples and the back of her neck. She uses Advil  and Excedrin Migraine for relief and occasionally takes her mother's prescription migraine medication, which she finds effective.  In September, she underwent surgery for a right ankle injury involving a fractured heel and torn Achilles tendon. Initially, she used meloxicam  for pain, which was ineffective, leading her to take high doses of Advil . Post-surgery, she has reduced her Advil  intake to manage headaches and occasional pain.  Her current medications include Zyrtec, Lexapro, estradiol , Wixela, Mirena, Zofran, and Zepbound. She recently started an estrogen patch and Lexapro in March. She discontinued Adipex due to high blood pressure concerns. Also knows should not take nsaids until her bp is better controlled.  She has a history of iron deficiency anemia and low vitamin D  levels. She experiences occasional nausea, which she manages with Zofran, though she is uncertain of its effectiveness.        Past Medical History:  Diagnosis Date   Allergy    Anemia    Anxiety    Asthma    GERD (gastroesophageal reflux disease)    OSA (obstructive sleep apnea) 02/03/2017     Social History   Socioeconomic History   Marital  status: Married    Spouse name: Acupuncturist   Number of children: 3   Years of education: Not on file   Highest education level: Not on file  Occupational History   Occupation: part time clerical; stay at home   Tobacco Use   Smoking status: Never   Smokeless tobacco: Never  Vaping Use   Vaping status: Never Used  Substance and Sexual Activity   Alcohol use: Yes    Comment: 2-3 glasses of wine   Drug use: No   Sexual activity: Never    Birth control/protection: I.U.D.  Other Topics Concern   Not on file  Social History Narrative   Not on file   Social Drivers of Health   Financial Resource Strain: Not on file  Food Insecurity: No Food Insecurity (05/10/2023)   Hunger Vital Sign    Worried About Running Out of Food in the Last Year: Never true    Ran Out of Food in the Last Year: Never true  Transportation Needs: No Transportation Needs (05/10/2023)   PRAPARE - Administrator, Civil Service (Medical): No    Lack of Transportation (Non-Medical): No  Physical Activity: Not on file  Stress: Not on file  Social Connections: Not on file  Intimate Partner Violence: Not At Risk (05/10/2023)   Humiliation, Afraid, Rape, and Kick questionnaire    Fear of Current or Ex-Partner: No    Emotionally Abused: No    Physically Abused: No    Sexually Abused: No    Past Surgical History:  Procedure Laterality Date  WISDOM TOOTH EXTRACTION     age 47    Family History  Problem Relation Age of Onset   Asthma Mother    Sleep apnea Mother    Obesity Mother    Hypertension Father    Obesity Father    Hypertension Paternal Uncle    Heart disease Maternal Grandmother    Asthma Brother     Allergies  Allergen Reactions   Levofloxacin Rash   Tramadol  Nausea Only and Other (See Comments)    Tingling in feet, headache, body temp change from hot to cold.    Current Outpatient Medications on File Prior to Visit  Medication Sig Dispense Refill   estradiol  (CLIMARA  - DOSED IN  MG/24 HR) 0.05 mg/24hr patch APPLY 1 PATCH BY TRANSDERMAL ROUTE EVERY WEEK     ZEPBOUND 15 MG/0.5ML Pen SMARTSIG:15 Milligram(s) SUB-Q Once a Week     cetirizine (ZYRTEC) 10 MG tablet Take 10 mg by mouth daily.     escitalopram (LEXAPRO) 10 MG tablet Take 10 mg by mouth daily.     fluticasone -salmeterol (WIXELA INHUB) 100-50 MCG/ACT AEPB Inhale 1 puff into the lungs 2 (two) times daily. 60 each 0   levonorgestrel (MIRENA) 20 MCG/24HR IUD Mirena 20 mcg/24 hours (5 yrs) 52 mg intrauterine device     ondansetron (ZOFRAN-ODT) 8 MG disintegrating tablet Take 8 mg by mouth every 8 (eight) hours as needed.     No current facility-administered medications on file prior to visit.    BP (!) 170/98   Pulse 78   Temp 98 F (36.7 C)   Resp 18   Ht 5\' 3"  (1.6 m)   Wt 217 lb 6.4 oz (98.6 kg)   SpO2 98%   BMI 38.51 kg/m            Review of Systems  Constitutional:  Negative for chills, fatigue and fever.  Respiratory:  Negative for cough, chest tightness and wheezing.   Cardiovascular:  Negative for chest pain and palpitations.  Gastrointestinal:  Negative for abdominal pain, blood in stool, diarrhea and nausea.  Genitourinary:  Negative for dysuria and flank pain.  Musculoskeletal:  Negative for back pain and joint swelling.       Rt ankle pain at times.  Skin:  Negative for rash.  Neurological:  Positive for headaches. Negative for facial asymmetry, speech difficulty, weakness and light-headedness.       See hpi.   Hematological:  Negative for adenopathy. Does not bruise/bleed easily.  Psychiatric/Behavioral:  Negative for behavioral problems, confusion, dysphoric mood and suicidal ideas. The patient is nervous/anxious.        Stress. Family related.      Past Medical History:  Diagnosis Date   Allergy    Anemia    Anxiety    Asthma    GERD (gastroesophageal reflux disease)    OSA (obstructive sleep apnea) 02/03/2017     Social History   Socioeconomic History   Marital  status: Married    Spouse name: Acupuncturist   Number of children: 3   Years of education: Not on file   Highest education level: Not on file  Occupational History   Occupation: part time clerical; stay at home   Tobacco Use   Smoking status: Never   Smokeless tobacco: Never  Vaping Use   Vaping status: Never Used  Substance and Sexual Activity   Alcohol use: Yes    Comment: 2-3 glasses of wine   Drug use: No   Sexual activity: Never  Birth control/protection: I.U.D.  Other Topics Concern   Not on file  Social History Narrative   Not on file   Social Drivers of Health   Financial Resource Strain: Not on file  Food Insecurity: No Food Insecurity (05/10/2023)   Hunger Vital Sign    Worried About Running Out of Food in the Last Year: Never true    Ran Out of Food in the Last Year: Never true  Transportation Needs: No Transportation Needs (05/10/2023)   PRAPARE - Administrator, Civil Service (Medical): No    Lack of Transportation (Non-Medical): No  Physical Activity: Not on file  Stress: Not on file  Social Connections: Not on file  Intimate Partner Violence: Not At Risk (05/10/2023)   Humiliation, Afraid, Rape, and Kick questionnaire    Fear of Current or Ex-Partner: No    Emotionally Abused: No    Physically Abused: No    Sexually Abused: No    Past Surgical History:  Procedure Laterality Date   WISDOM TOOTH EXTRACTION     age 24    Family History  Problem Relation Age of Onset   Asthma Mother    Sleep apnea Mother    Obesity Mother    Hypertension Father    Obesity Father    Hypertension Paternal Uncle    Heart disease Maternal Grandmother    Asthma Brother     Allergies  Allergen Reactions   Levofloxacin Rash   Tramadol  Nausea Only and Other (See Comments)    Tingling in feet, headache, body temp change from hot to cold.    Current Outpatient Medications on File Prior to Visit  Medication Sig Dispense Refill   estradiol  (CLIMARA  - DOSED IN  MG/24 HR) 0.05 mg/24hr patch APPLY 1 PATCH BY TRANSDERMAL ROUTE EVERY WEEK     ZEPBOUND 15 MG/0.5ML Pen SMARTSIG:15 Milligram(s) SUB-Q Once a Week     cetirizine (ZYRTEC) 10 MG tablet Take 10 mg by mouth daily.     escitalopram (LEXAPRO) 10 MG tablet Take 10 mg by mouth daily.     fluticasone -salmeterol (WIXELA INHUB) 100-50 MCG/ACT AEPB Inhale 1 puff into the lungs 2 (two) times daily. 60 each 0   levonorgestrel (MIRENA) 20 MCG/24HR IUD Mirena 20 mcg/24 hours (5 yrs) 52 mg intrauterine device     ondansetron (ZOFRAN-ODT) 8 MG disintegrating tablet Take 8 mg by mouth every 8 (eight) hours as needed.     No current facility-administered medications on file prior to visit.    BP (!) 170/98   Pulse 78   Temp 98 F (36.7 C)   Resp 18   Ht 5\' 3"  (1.6 m)   Wt 217 lb 6.4 oz (98.6 kg)   SpO2 98%   BMI 38.51 kg/m          Objective:   Physical Exam  General Mental Status- Alert. General Appearance- Not in acute distress.   Skin General: Color- Normal Color. Moisture- Normal Moisture.  Neck Carotid Arteries- Normal color. Moisture- Normal Moisture. No carotid bruits. No JVD.  Chest and Lung Exam Auscultation: Breath Sounds:-CTA  Cardiovascular Auscultation:Rythm- RRR Murmurs & Other Heart Sounds:Auscultation of the heart reveals- No Murmurs.  Abdomen Inspection:-Inspeection Normal. Palpation/Percussion:Note:No mass. Palpation and Percussion of the abdomen reveal- Non Tender, Non Distended + BS, no rebound or guarding.   Neurologic Cranial Nerve exam:- CN III-XII intact(No nystagmus), symmetric smile. Strength:- 5/5 equal and symmetric strength both upper and lower extremities.       Assessment &  Plan:  Hypertension Hypertension with recent elevation to 180/116 mmHg. Discontinued phentermine due to elevated blood pressure. Stress and anxiety may contribute but not fully explain elevation. Current migraines may be related to elevated blood pressure. Valsartan and  metoprolol prescribed to manage blood pressure and potentially prevent migraines. Goal: reduce blood pressure to 130/80 mmHg before considering migraine-specific medication zomig. - Prescribed valsartan, starting with high dose due to significantly elevated blood pressure. - Prescribed metoprolol extended release 25 mg to control blood pressure and potentially prevent migraines. - Advised against using phentermine due to potential to elevate blood pressure. - Recheck blood pressure in one week to assess response to treatment. - Advised to seek emergency care if severe symptoms such as vision changes, weakness, or severe headache occur.(No motor or sensory deficts presently)   Migraine Recurrent migraines approximately once a week, characterized by nausea, vomiting, photophobia, and phonophobia. Current blood pressure elevation precludes use of triptans. Metoprolol may help prevent migraines once blood pressure is controlled. - Avoid starting sumatriptan until blood pressure is controlled. - Use acetaminophen for headache management to avoid increasing blood pressure. - Consider starting Zomig once blood pressure is controlled. Plan to rx on next visit.  Anxiety Anxiety with GAD-7 score of 19. Currently on Lexapro for anxiety management, prescribed by her OB/GYN. Reports not feeling depressed. Buspar added to further address anxiety. - Continue Lexapro for anxiety management. - Prescribed Buspar 7.5 mg twice daily to further address anxiety.  Right Ankle Fracture and Achilles Tendon Tear Right calcaneal fracture and Achilles tendon tear, surgically repaired. Occasional swelling and discomfort reported.  General Health Maintenance Routine health maintenance labs indicated due to history of iron deficiency anemia and low vitamin D  levels. Additional labs ordered to assess current health status and potential side effects of medications. - Order CBC, iron panel, lipid panel, A1c, metabolic panel,  and vitamin D  level. - Check thyroid  function due to reported fatigue. - Check lipase level due to potential pancreatitis risk from weight loss medication.  Follow-up Follow-up necessary to monitor blood pressure response to new medications and review lab results. - Schedule follow-up appointment in one week to reassess blood pressure and review lab results.   Time spent with patient today was  41 minutes which consisted of chart review, discussing diagnosis, work up treatment and documentation.    Keven Soucy, PA-C

## 2024-04-22 NOTE — Patient Instructions (Signed)
 Hypertension Hypertension with recent elevation to 180/116 mmHg. Discontinued phentermine due to elevated blood pressure. Stress and anxiety may contribute but not fully explain elevation. Current migraines may be related to elevated blood pressure. Valsartan and metoprolol prescribed to manage blood pressure and potentially prevent migraines. Goal: reduce blood pressure to 130/80 mmHg before considering migraine-specific medication zomig. - Prescribed valsartan, starting with high dose due to significantly elevated blood pressure. - Prescribed metoprolol extended release 25 mg to control blood pressure and potentially prevent migraines. - Advised against using phentermine due to potential to elevate blood pressure. - Recheck blood pressure in one week to assess response to treatment. - Advised to seek emergency care if severe symptoms such as vision changes, weakness, or severe headache occur.(No motor or sensory deficts presently)   Migraine Recurrent migraines approximately once a week, characterized by nausea, vomiting, photophobia, and phonophobia. Current blood pressure elevation precludes use of triptans. Metoprolol may help prevent migraines once blood pressure is controlled. - Avoid starting sumatriptan until blood pressure is controlled. - Use acetaminophen for headache management to avoid increasing blood pressure. - Consider starting Zomig once blood pressure is controlled. Plan to rx on next visit.  Anxiety Anxiety with GAD-7 score of 19. Currently on Lexapro for anxiety management, prescribed by her OB/GYN. Reports not feeling depressed. Buspar added to further address anxiety. - Continue Lexapro for anxiety management. - Prescribed Buspar 7.5 mg twice daily to further address anxiety.  Right Ankle Fracture and Achilles Tendon Tear Right calcaneal fracture and Achilles tendon tear, surgically repaired. Occasional swelling and discomfort reported.  General Health  Maintenance Routine health maintenance labs indicated due to history of iron deficiency anemia and low vitamin D  levels. Additional labs ordered to assess current health status and potential side effects of medications. - Order CBC, iron panel, lipid panel, A1c, metabolic panel, and vitamin D  level. - Check thyroid  function due to reported fatigue. - Check lipase level due to potential pancreatitis risk from weight loss medication.  Follow-up Follow-up necessary to monitor blood pressure response to new medications and review lab results. - Schedule follow-up appointment in one week to reassess blood pressure and review lab results.

## 2024-04-25 ENCOUNTER — Ambulatory Visit: Payer: Self-pay | Admitting: Medical

## 2024-04-25 LAB — VITAMIN D 25 HYDROXY (VIT D DEFICIENCY, FRACTURES): VITD: 12.97 ng/mL — ABNORMAL LOW (ref 30.00–100.00)

## 2024-04-25 MED ORDER — VITAMIN D (ERGOCALCIFEROL) 1.25 MG (50000 UNIT) PO CAPS: 50000.0000 [IU] | ORAL_CAPSULE | ORAL | 0 refills | Status: DC

## 2024-04-25 NOTE — Addendum Note (Signed)
 Addended by: Serafina Damme on: 04/25/2024 04:41 PM   Modules accepted: Orders

## 2024-04-29 ENCOUNTER — Encounter: Payer: Self-pay | Admitting: Medical

## 2024-04-29 ENCOUNTER — Ambulatory Visit: Admitting: Medical

## 2024-04-29 VITALS — BP 120/80 | HR 75 | Resp 18 | Ht 63.0 in | Wt 217.6 lb

## 2024-04-29 DIAGNOSIS — G43009 Migraine without aura, not intractable, without status migrainosus: Secondary | ICD-10-CM | POA: Diagnosis not present

## 2024-04-29 DIAGNOSIS — F419 Anxiety disorder, unspecified: Secondary | ICD-10-CM

## 2024-04-29 DIAGNOSIS — I10 Essential (primary) hypertension: Secondary | ICD-10-CM

## 2024-04-29 MED ORDER — ZOLMITRIPTAN 5 MG PO TABS: 5.0000 mg | ORAL_TABLET | ORAL | 0 refills | Status: AC | PRN

## 2024-04-29 NOTE — Patient Instructions (Signed)
 Hypertension Hypertension well-controlled with valsartan  and metoprolol . Valsartan  chosen for elevated blood pressure, metoprolol  added for migraine prevention. Adjust valsartan  if blood pressure consistently below 110 with lightheadedness. - Continue valsartan  160 mg daily. - Continue metoprolol  extended release 25 mg daily. - Monitor blood pressure two to three times a week. - If lightheadedness occurs and blood pressure is consistently below 110, reduce valsartan  to 80 mg daily.  Migraine Migraines less frequent, possibly due to metoprolol . Managed recent migraine with acetaminophen. Sumatriptan effective for acute attacks. Check blood pressure before NSAIDs. - Prescribe sumatriptan 5 mg for acute attacks. Take at onset, repeat in 2 hours if needed, max 2 tablets in 24 hours. - Check blood pressure before NSAIDs like ibuprofen . If 130/80 or less, ibuprofen  can be taken for low-level headaches. - Combine ibuprofen  with acetaminophen for non-migraine headaches if needed.  Anxiety On buspirone  and escitalopram. Occasional forgets to take second daily, but takes at least one dose daily. Escitalopram prescribed by gynecologist. - Continue buspirone  as prescribed, ideally twice daily. - Continue escitalopram 10 mg daily.  Vitamin D  Deficiency On weekly vitamin D  supplement. Fatigue may be related to low vitamin D . Levels to be rechecked in 7-8 weeks. - Continue weekly vitamin D  supplementation. - Schedule follow-up appointment in 7-8 weeks to recheck vitamin D  levels.  Follow-up Scheduled follow-up with podiatrist for ankle fracture and tendon repair. - Attend follow-up appointment with podiatrist in August.

## 2024-04-29 NOTE — Progress Notes (Signed)
 Subjective:    Patient ID: Valerie Burke, female    DOB: 07/23/1976, 48 y.o.   MRN: [id number]  HPI Valerie Burke is a 48 year old female with hypertension, migraines, and anxiety who presents for follow-up of her blood pressure management and migraine frequency.  Her blood pressure has significantly improved since her last visit, with recent readings as low as 111/78 mmHg and 114/80 mmHg, both taken manually. Previously, her blood pressure was around 170 mmHg. She is currently taking valsartan  160 mg daily and has access to blood pressure machines at work and a friend's house.  She has a history of migraines and experienced a migraine this week, although they have become less frequent. She previously used Excedrin Migraine and Advil  for relief but has been avoiding them due to her blood pressure concerns. She has used sumatriptan in the past, borrowed from her mother, and found it effective. Her migraines are described as 'classic' with symptoms of nausea, light sensitivity, and sound sensitivity.  She is managing anxiety and is currently taking Buspar , although she sometimes forgets the second dose, taking it at least once daily. She is also on Lexapro 10 mg daily, prescribed by her gynecologist.  She mentions a history of an ankle fracture and tendon repair, with a follow-up appointment scheduled with her surgeon in August. She is under the care of a podiatrist for this condition.  She is on a weekly vitamin D  supplement due to low levels, which she associates with fatigue.    Review of Systems  Constitutional:  Positive for fatigue. Negative for chills and fever.  HENT:  Negative for congestion.   Respiratory:  Negative for cough, chest tightness, shortness of breath and wheezing.   Cardiovascular:  Negative for chest pain and palpitations.  Genitourinary:  Negative for dysuria, flank pain and frequency.  Musculoskeletal:  Negative for back pain.  Skin:  Negative for rash.   Neurological:  Negative for dizziness and headaches.       No current ha.  Hematological:  Negative for adenopathy. Does not bruise/bleed easily.  Psychiatric/Behavioral:  Negative for behavioral problems, confusion, decreased concentration, dysphoric mood, sleep disturbance and suicidal ideas. The patient is nervous/anxious. The patient is not hyperactive.     Past Medical History:  Diagnosis Date   Allergy    Anemia    Anxiety    Asthma    GERD (gastroesophageal reflux disease)    OSA (obstructive sleep apnea) 02/03/2017     Social History   Socioeconomic History   Marital status: Married    Spouse name: Acupuncturist   Number of children: 3   Years of education: Not on file   Highest education level: Not on file  Occupational History   Occupation: part time clerical; stay at home   Tobacco Use   Smoking status: Never   Smokeless tobacco: Never  Vaping Use   Vaping status: Never Used  Substance and Sexual Activity   Alcohol use: Yes    Comment: 2-3 glasses of wine   Drug use: No   Sexual activity: Never    Birth control/protection: I.U.D.  Other Topics Concern   Not on file  Social History Narrative   Not on file   Social Drivers of Health   Financial Resource Strain: Not on file  Food Insecurity: No Food Insecurity (05/10/2023)   Hunger Vital Sign    Worried About Running Out of Food in the Last Year: Never true    Ran Out of  Food in the Last Year: Never true  Transportation Needs: No Transportation Needs (05/10/2023)   PRAPARE - Administrator, Civil Service (Medical): No    Lack of Transportation (Non-Medical): No  Physical Activity: Not on file  Stress: Not on file  Social Connections: Not on file  Intimate Partner Violence: Not At Risk (05/10/2023)   Humiliation, Afraid, Rape, and Kick questionnaire    Fear of Current or Ex-Partner: No    Emotionally Abused: No    Physically Abused: No    Sexually Abused: No    Past Surgical History:  Procedure  Laterality Date   WISDOM TOOTH EXTRACTION     age 76    Family History  Problem Relation Age of Onset   Asthma Mother    Sleep apnea Mother    Obesity Mother    Hypertension Father    Obesity Father    Hypertension Paternal Uncle    Heart disease Maternal Grandmother    Asthma Brother     Allergies  Allergen Reactions   Levofloxacin Rash   Tramadol  Nausea Only and Other (See Comments)    Tingling in feet, headache, body temp change from hot to cold.    Current Outpatient Medications on File Prior to Visit  Medication Sig Dispense Refill   busPIRone  (BUSPAR ) 7.5 MG tablet Take 1 tablet (7.5 mg total) by mouth 2 (two) times daily. 60 tablet 0   cetirizine (ZYRTEC) 10 MG tablet Take 10 mg by mouth daily.     escitalopram (LEXAPRO) 10 MG tablet Take 10 mg by mouth daily.     estradiol  (CLIMARA  - DOSED IN MG/24 HR) 0.05 mg/24hr patch APPLY 1 PATCH BY TRANSDERMAL ROUTE EVERY WEEK     fluticasone -salmeterol (WIXELA INHUB) 100-50 MCG/ACT AEPB Inhale 1 puff into the lungs 2 (two) times daily. 60 each 0   levonorgestrel (MIRENA) 20 MCG/24HR IUD Mirena 20 mcg/24 hours (5 yrs) 52 mg intrauterine device     metoprolol  succinate (TOPROL -XL) 25 MG 24 hr tablet Take 1 tablet (25 mg total) by mouth daily. 90 tablet 0   ondansetron (ZOFRAN-ODT) 8 MG disintegrating tablet Take 8 mg by mouth every 8 (eight) hours as needed.     valsartan  (DIOVAN ) 160 MG tablet Take 1 tablet (160 mg total) by mouth daily. 90 tablet 0   Vitamin D , Ergocalciferol , (DRISDOL ) 1.25 MG (50000 UNIT) CAPS capsule Take 1 capsule (50,000 Units total) by mouth every 7 (seven) days. 8 capsule 0   ZEPBOUND 15 MG/0.5ML Pen SMARTSIG:15 Milligram(s) SUB-Q Once a Week     No current facility-administered medications on file prior to visit.    BP 120/80   Pulse 75   Resp 18   Ht 5' 3 (1.6 m)   Wt 217 lb 9.6 oz (98.7 kg)   SpO2 98%   BMI 38.55 kg/m        Objective:   Physical Exam  General Mental Status- Alert.  General Appearance- Not in acute distress.   Skin General: Color- Normal Color. Moisture- Normal Moisture.  Neck No JVD.  Chest and Lung Exam Auscultation: Breath Sounds:-CTA  Cardiovascular Auscultation:Rythm- RRR Murmurs & Other Heart Sounds:Auscultation of the heart reveals- No Murmurs.  Abdomen Inspection:-Inspeection Normal. Palpation/Percussion:Note:No mass. Palpation and Percussion of the abdomen reveal- Non Tender, Non Distended + BS, no rebound or guarding.   Neurologic Cranial Nerve exam:- CN III-XII intact(No nystagmus), symmetric smile. Strength:- 5/5 equal and symmetric strength both upper and lower extremities.  Assessment & Plan:   Hypertension Hypertension well-controlled with valsartan  and metoprolol . Valsartan  chosen for elevated blood pressure, metoprolol  added for migraine prevention. Adjust valsartan  if blood pressure consistently below 110 with lightheadedness. - Continue valsartan  160 mg daily. - Continue metoprolol  extended release 25 mg daily. - Monitor blood pressure two to three times a week. - If lightheadedness occurs and blood pressure is consistently below 110, reduce valsartan  to 80 mg daily.  Migraine Migraines less frequent, possibly due to metoprolol . Managed recent migraine with acetaminophen. Sumatriptan effective for acute attacks. Check blood pressure before NSAIDs. - Prescribe sumatriptan 5 mg for acute attacks. Take at onset, repeat in 2 hours if needed, max 2 tablets in 24 hours. - Check blood pressure before NSAIDs like ibuprofen . If 130/80 or less, ibuprofen  can be taken for low-level headaches. - Combine ibuprofen  with acetaminophen for non-migraine headaches if needed.  Anxiety On buspirone  and escitalopram. Occasional forgets to take second daily, but takes at least one dose daily. Escitalopram prescribed by gynecologist. - Continue buspirone  as prescribed, ideally twice daily. - Continue escitalopram 10 mg  daily.  Vitamin D  Deficiency On weekly vitamin D  supplement. Fatigue may be related to low vitamin D . Levels to be rechecked in 7-8 weeks. - Continue weekly vitamin D  supplementation. - Schedule follow-up appointment in 7-8 weeks to recheck vitamin D  levels.  Follow-up Scheduled follow-up with podiatrist for ankle fracture and tendon repair. - Attend follow-up appointment with podiatrist in August.  Sylvia Everts, PA-C

## 2024-05-14 ENCOUNTER — Other Ambulatory Visit: Payer: Self-pay | Admitting: Medical

## 2024-05-16 ENCOUNTER — Other Ambulatory Visit: Payer: Self-pay | Admitting: Medical

## 2024-07-18 ENCOUNTER — Other Ambulatory Visit: Payer: Self-pay | Admitting: Medical

## 2024-08-06 ENCOUNTER — Telehealth: Payer: Self-pay | Admitting: Medical

## 2024-08-08 ENCOUNTER — Ambulatory Visit: Admitting: Family Medicine

## 2024-08-08 NOTE — Telephone Encounter (Signed)
Disregard this.

## 2024-08-17 ENCOUNTER — Other Ambulatory Visit: Payer: Self-pay | Admitting: Medical

## 2024-08-27 ENCOUNTER — Ambulatory Visit: Admitting: Medical

## 2024-08-27 ENCOUNTER — Encounter: Payer: Self-pay | Admitting: Medical

## 2024-08-27 ENCOUNTER — Other Ambulatory Visit: Payer: Self-pay

## 2024-08-27 ENCOUNTER — Ambulatory Visit (HOSPITAL_BASED_OUTPATIENT_CLINIC_OR_DEPARTMENT_OTHER)
Admission: RE | Admit: 2024-08-27 | Discharge: 2024-08-27 | Disposition: A | Discharge status: Home / Self Care | Source: Ambulatory Visit | Attending: Medical | Admitting: Medical

## 2024-08-27 ENCOUNTER — Ambulatory Visit: Payer: Self-pay | Admitting: Medical

## 2024-08-27 VITALS — BP 120/80 | HR 68 | Temp 97.7°F | Resp 16 | Ht 63.0 in | Wt 233.4 lb

## 2024-08-27 DIAGNOSIS — J40 Bronchitis, not specified as acute or chronic: Secondary | ICD-10-CM | POA: Diagnosis not present

## 2024-08-27 DIAGNOSIS — R059 Cough, unspecified: Secondary | ICD-10-CM | POA: Diagnosis present

## 2024-08-27 DIAGNOSIS — R5383 Other fatigue: Secondary | ICD-10-CM | POA: Diagnosis not present

## 2024-08-27 MED ORDER — FLUCONAZOLE 150 MG PO TABS
150.0000 mg | ORAL_TABLET | Freq: Once | ORAL | 0 refills | Status: AC
Start: 2024-08-27 — End: 2024-08-27

## 2024-08-27 MED ORDER — ALBUTEROL SULFATE HFA 108 (90 BASE) MCG/ACT IN AERS: 2.0000 | INHALATION_SPRAY | Freq: Four times a day (QID) | RESPIRATORY_TRACT | 0 refills | Status: AC | PRN

## 2024-08-27 MED ORDER — AZITHROMYCIN 250 MG PO TABS: ORAL_TABLET | ORAL | 0 refills | Status: AC

## 2024-08-27 MED ORDER — GUAIFENESIN-CODEINE 100-10 MG/5ML PO SOLN: 5.0000 mL | Freq: Four times a day (QID) | ORAL | 0 refills | Status: AC | PRN

## 2024-08-27 MED ORDER — BENZONATATE 100 MG PO CAPS: 100.0000 mg | ORAL_CAPSULE | Freq: Three times a day (TID) | ORAL | 0 refills | Status: DC | PRN

## 2024-08-27 MED ORDER — HYDROXYZINE HCL 10 MG PO TABS: ORAL_TABLET | ORAL | 0 refills | Status: DC

## 2024-08-27 NOTE — Progress Notes (Signed)
 Subjective:    Patient ID: Valerie Burke, female    DOB: Jun 09, 1976, 48 y.o.   MRN: [id number]  HPI   Valerie Burke is a 48 year old female with recurrent pneumonia who presents with persistent respiratory symptoms following a sinus infection.  She experienced a sinus infection beginning around August 07, 2024, initially treated with amoxicillin  and a prednisone  taper, which did not alleviate her symptoms. She had maxillary sinus pressure, nasal congestion, and dental pain, which she identifies as a telltale sign of sinus infection.  On August 13, 2024, she returned to the clinic due to persistent symptoms and was prescribed Augmentin  and received two steroid injections. This treatment provided some relief but did not completely resolve her symptoms. She completed a ten-day course of Augmentin , finishing on August 23, 2024.  Currently, her sinus symptoms have subsided, but she now experiences chest congestion. She describes a sensation of 'tasting the infection' and has a productive cough, though not as much as she would like. No significant fever, chills, or sweats, but she notes feeling hot at times, which she attributes to either illness or possible hot flashes.  She experiences significant fatigue, feeling worn out after minimal exertion, such as walking to the bathroom. Despite adequate sleep, she continues to feel tired and lacks energy. She also reports a sensation of weakness in her legs, which she associates with having an infection.  She has a history of asthma or reactive airways, particularly when ill, and has had pneumonia approximately four times, most recently two years ago. Deep breaths can trigger coughing and she feels she is 'borderline' wheezing.  She has been on antibiotics for the past two weeks.        Review of Systems  Constitutional:  Positive for fatigue and fever. Negative for chills.       Subjective fever.  HENT:  Negative for congestion and ear  pain.   Respiratory:  Positive for cough. Negative for wheezing.   Cardiovascular:  Negative for chest pain and palpitations.  Gastrointestinal:  Negative for abdominal pain.  Musculoskeletal:  Negative for back pain and myalgias.  Skin:  Negative for rash.  Psychiatric/Behavioral:  Negative for behavioral problems and decreased concentration.     Past Medical History:  Diagnosis Date   Allergy    Anemia    Anxiety    Asthma    GERD (gastroesophageal reflux disease)    OSA (obstructive sleep apnea) 02/03/2017     Social History   Socioeconomic History   Marital status: Married    Spouse name: Acupuncturist   Number of children: 3   Years of education: Not on file   Highest education level: Not on file  Occupational History   Occupation: part time clerical; stay at home   Tobacco Use   Smoking status: Never   Smokeless tobacco: Never  Vaping Use   Vaping status: Never Used  Substance and Sexual Activity   Alcohol use: Yes    Comment: 2-3 glasses of wine   Drug use: No   Sexual activity: Never    Birth control/protection: I.U.D.  Other Topics Concern   Not on file  Social History Narrative   Not on file   Social Drivers of Health   Financial Resource Strain: Not on file  Food Insecurity: No Food Insecurity (05/10/2023)   Hunger Vital Sign    Worried About Running Out of Food in the Last Year: Never true    Ran Out of Food in  the Last Year: Never true  Transportation Needs: No Transportation Needs (05/10/2023)   PRAPARE - Administrator, Civil Service (Medical): No    Lack of Transportation (Non-Medical): No  Physical Activity: Not on file  Stress: Not on file  Social Connections: Not on file  Intimate Partner Violence: Not At Risk (05/10/2023)   Humiliation, Afraid, Rape, and Kick questionnaire    Fear of Current or Ex-Partner: No    Emotionally Abused: No    Physically Abused: No    Sexually Abused: No    Past Surgical History:  Procedure Laterality  Date   WISDOM TOOTH EXTRACTION     age 27    Family History  Problem Relation Age of Onset   Asthma Mother    Sleep apnea Mother    Obesity Mother    Hypertension Father    Obesity Father    Hypertension Paternal Uncle    Heart disease Maternal Grandmother    Asthma Brother     Allergies  Allergen Reactions   Levofloxacin Rash   Tramadol  Nausea Only and Other (See Comments)    Tingling in feet, headache, body temp change from hot to cold.    Current Outpatient Medications on File Prior to Visit  Medication Sig Dispense Refill   busPIRone  (BUSPAR ) 7.5 MG tablet TAKE 1 TABLET BY MOUTH 2 TIMES DAILY. 180 tablet 0   cetirizine (ZYRTEC) 10 MG tablet Take 10 mg by mouth daily.     escitalopram (LEXAPRO) 10 MG tablet Take 10 mg by mouth daily.     estradiol  (CLIMARA  - DOSED IN MG/24 HR) 0.05 mg/24hr patch APPLY 1 PATCH BY TRANSDERMAL ROUTE EVERY WEEK     levonorgestrel (MIRENA) 20 MCG/24HR IUD Mirena 20 mcg/24 hours (5 yrs) 52 mg intrauterine device     metoprolol  succinate (TOPROL -XL) 25 MG 24 hr tablet TAKE 1 TABLET (25 MG TOTAL) BY MOUTH DAILY. 90 tablet 0   ondansetron (ZOFRAN-ODT) 8 MG disintegrating tablet Take 8 mg by mouth every 8 (eight) hours as needed.     valsartan  (DIOVAN ) 160 MG tablet TAKE 1 TABLET BY MOUTH EVERY DAY 90 tablet 0   Vitamin D , Ergocalciferol , (DRISDOL ) 1.25 MG (50000 UNIT) CAPS capsule TAKE 1 CAPSULE (50,000 UNITS TOTAL) BY MOUTH EVERY 7 (SEVEN) DAYS 12 capsule 1   WEGOVY 1.7 MG/0.75ML SOAJ SQ injection Inject 1.7 mg into the skin once a week.     zolmitriptan  (ZOMIG ) 5 MG tablet Take 1 tablet (5 mg total) by mouth as needed for migraine. 10 tablet 0   fluticasone -salmeterol (WIXELA INHUB) 100-50 MCG/ACT AEPB Inhale 1 puff into the lungs 2 (two) times daily. (Patient not taking: Reported on 08/27/2024) 60 each 0   No current facility-administered medications on file prior to visit.    BP (!) 122/90   Pulse 68   Temp 97.7 F (36.5 C) (Oral)   Resp  16   Ht 5' 3 (1.6 m)   Wt 233 lb 6.4 oz (105.9 kg)   SpO2 99%   BMI 41.34 kg/m        Objective:   Physical Exam  General Mental Status- Alert. General Appearance- Not in acute distress.   Skin General: Color- Normal Color. Moisture- Normal Moisture.  Neck Carotid Arteries- Normal color. Moisture- Normal Moisture. No carotid bruits. No JVD.  Chest and Lung Exam Auscultation: Breath Sounds:-CTA  Cardiovascular Auscultation:Rythm- Regular. Murmurs & Other Heart Sounds:Auscultation of the heart reveals- No Murmurs.  Abdomen Inspection:-Inspeection Normal. Palpation/Percussion:Note:No mass. Palpation  and Percussion of the abdomen reveal- Non Tender, Non Distended + BS, no rebound or guarding.   Neurologic Cranial Nerve exam:- CN III-XII intact(No nystagmus), symmetric smile. Strength:- 5/5 equal and symmetric strength both upper and lower extremities.       Assessment & Plan:   Patient Instructions  Acute bronchitis with early reactive airway disease Persistent symptoms despite Augmentin  and steroids. Possible walking pneumonia. - Order chest x-ray to rule out infection at lung bases. - Order CBC. - Prescribe azithromycin  if symptoms worsen.(Discussed scenario to start but not presently unless cxr shows pneumonia) - Prescribe albuterol  inhaler for wheezing. - Prescribe Robitussin AC as needed for cough - Prescribe Diflucan  as a precautionary measure for yeast infection if occurs.  Follow up 10 days or sooner if needed.    Dallas Maxwell, PA-C    Pt mentioned at end of exam borrowed daughter hydroxyzine  and it helped with anxiety. States worked. Explained can use sparingly. Explained side effects and encourage just to use at night.

## 2024-08-27 NOTE — Patient Instructions (Signed)
 Acute bronchitis with early reactive airway disease Persistent symptoms despite Augmentin  and steroids. Possible walking pneumonia. - Order chest x-ray to rule out infection at lung bases. - Order CBC. - Prescribe azithromycin  if symptoms worsen.(Discussed scenario to start but not presently unless cxr shows pneumonia) - Prescribe albuterol  inhaler for wheezing. - Prescribe Robitussin AC as needed for cough - Prescribe Diflucan  as a precautionary measure for yeast infection if occurs.  Follow up 10 days or sooner if needed.

## 2024-08-30 ENCOUNTER — Telehealth: Payer: Self-pay | Admitting: Family Medicine

## 2024-08-30 NOTE — Telephone Encounter (Signed)
 Left a voicemail with the patient to come back and get re draw for cbc we can not find  the tube.

## 2024-08-30 NOTE — Addendum Note (Signed)
 Addended by: TRUDY CURVIN RAMAN on: 08/30/2024 03:22 PM   Modules accepted: Orders

## 2024-09-03 ENCOUNTER — Other Ambulatory Visit: Payer: Self-pay | Admitting: Medical

## 2024-10-17 ENCOUNTER — Other Ambulatory Visit: Payer: Self-pay | Admitting: Medical

## 2024-11-01 ENCOUNTER — Other Ambulatory Visit: Payer: Self-pay | Admitting: Medical

## 2024-12-06 ENCOUNTER — Other Ambulatory Visit: Payer: Self-pay | Admitting: Medical

## 2024-12-08 ENCOUNTER — Other Ambulatory Visit: Payer: Self-pay | Admitting: Medical
